# Patient Record
Sex: Male | Born: 1945 | Race: White | Hispanic: No | Marital: Married | Smoking: Smoker, current status unknown
Health system: Southern US, Community
[De-identification: ages and names within clinical notes are randomized; demographics above are authoritative.]

## PROBLEM LIST (undated history)

## (undated) DIAGNOSIS — I10 Essential (primary) hypertension: Secondary | ICD-10-CM

## (undated) DIAGNOSIS — E119 Type 2 diabetes mellitus without complications: Secondary | ICD-10-CM

## (undated) DIAGNOSIS — J449 Chronic obstructive pulmonary disease, unspecified: Secondary | ICD-10-CM

## (undated) DIAGNOSIS — I2510 Atherosclerotic heart disease of native coronary artery without angina pectoris: Secondary | ICD-10-CM

## (undated) HISTORY — PX: APPENDECTOMY: SHX54

## (undated) HISTORY — PX: CHOLECYSTECTOMY: SHX55

## (undated) HISTORY — PX: CORONARY ANGIOPLASTY WITH STENT PLACEMENT: SHX49

---

## 2019-04-06 DIAGNOSIS — I714 Abdominal aortic aneurysm, without rupture, unspecified: Secondary | ICD-10-CM

## 2019-04-06 DIAGNOSIS — R918 Other nonspecific abnormal finding of lung field: Secondary | ICD-10-CM

## 2019-04-06 HISTORY — DX: Abdominal aortic aneurysm, without rupture, unspecified: I71.40

## 2019-04-06 HISTORY — DX: Abdominal aortic aneurysm, without rupture: I71.4

## 2019-04-06 HISTORY — DX: Other nonspecific abnormal finding of lung field: R91.8

## 2019-04-10 ENCOUNTER — Inpatient Hospital Stay (HOSPITAL_COMMUNITY): Payer: Medicare Other

## 2019-04-10 ENCOUNTER — Encounter (HOSPITAL_COMMUNITY): Payer: Self-pay | Admitting: Internal Medicine

## 2019-04-10 ENCOUNTER — Other Ambulatory Visit: Payer: Self-pay

## 2019-04-10 ENCOUNTER — Inpatient Hospital Stay (HOSPITAL_COMMUNITY)
Admission: AD | Admit: 2019-04-10 | Discharge: 2019-04-15 | DRG: 871 | Disposition: A | Discharge status: Home Health | Payer: Medicare Other | Source: Other Acute Inpatient Hospital | Attending: Internal Medicine | Admitting: Internal Medicine

## 2019-04-10 DIAGNOSIS — Z886 Allergy status to analgesic agent status: Secondary | ICD-10-CM | POA: Diagnosis not present

## 2019-04-10 DIAGNOSIS — X58XXXA Exposure to other specified factors, initial encounter: Secondary | ICD-10-CM | POA: Diagnosis present

## 2019-04-10 DIAGNOSIS — S22089A Unspecified fracture of T11-T12 vertebra, initial encounter for closed fracture: Secondary | ICD-10-CM | POA: Diagnosis present

## 2019-04-10 DIAGNOSIS — J9811 Atelectasis: Secondary | ICD-10-CM | POA: Diagnosis present

## 2019-04-10 DIAGNOSIS — I2510 Atherosclerotic heart disease of native coronary artery without angina pectoris: Secondary | ICD-10-CM | POA: Diagnosis present

## 2019-04-10 DIAGNOSIS — Z9889 Other specified postprocedural states: Secondary | ICD-10-CM | POA: Diagnosis not present

## 2019-04-10 DIAGNOSIS — K219 Gastro-esophageal reflux disease without esophagitis: Secondary | ICD-10-CM | POA: Diagnosis present

## 2019-04-10 DIAGNOSIS — E43 Unspecified severe protein-calorie malnutrition: Secondary | ICD-10-CM | POA: Insufficient documentation

## 2019-04-10 DIAGNOSIS — R918 Other nonspecific abnormal finding of lung field: Secondary | ICD-10-CM | POA: Diagnosis present

## 2019-04-10 DIAGNOSIS — Z681 Body mass index (BMI) 19 or less, adult: Secondary | ICD-10-CM

## 2019-04-10 DIAGNOSIS — J189 Pneumonia, unspecified organism: Secondary | ICD-10-CM | POA: Diagnosis present

## 2019-04-10 DIAGNOSIS — I4891 Unspecified atrial fibrillation: Secondary | ICD-10-CM | POA: Diagnosis present

## 2019-04-10 DIAGNOSIS — Z9981 Dependence on supplemental oxygen: Secondary | ICD-10-CM | POA: Diagnosis not present

## 2019-04-10 DIAGNOSIS — J432 Centrilobular emphysema: Secondary | ICD-10-CM | POA: Diagnosis present

## 2019-04-10 DIAGNOSIS — J9621 Acute and chronic respiratory failure with hypoxia: Secondary | ICD-10-CM | POA: Diagnosis present

## 2019-04-10 DIAGNOSIS — J90 Pleural effusion, not elsewhere classified: Secondary | ICD-10-CM

## 2019-04-10 DIAGNOSIS — Z880 Allergy status to penicillin: Secondary | ICD-10-CM | POA: Diagnosis not present

## 2019-04-10 DIAGNOSIS — D649 Anemia, unspecified: Secondary | ICD-10-CM | POA: Diagnosis present

## 2019-04-10 DIAGNOSIS — F17210 Nicotine dependence, cigarettes, uncomplicated: Secondary | ICD-10-CM | POA: Diagnosis present

## 2019-04-10 DIAGNOSIS — I959 Hypotension, unspecified: Secondary | ICD-10-CM | POA: Diagnosis present

## 2019-04-10 DIAGNOSIS — Z955 Presence of coronary angioplasty implant and graft: Secondary | ICD-10-CM | POA: Diagnosis not present

## 2019-04-10 DIAGNOSIS — E119 Type 2 diabetes mellitus without complications: Secondary | ICD-10-CM | POA: Diagnosis present

## 2019-04-10 DIAGNOSIS — A419 Sepsis, unspecified organism: Principal | ICD-10-CM | POA: Diagnosis present

## 2019-04-10 DIAGNOSIS — R64 Cachexia: Secondary | ICD-10-CM | POA: Diagnosis present

## 2019-04-10 DIAGNOSIS — J449 Chronic obstructive pulmonary disease, unspecified: Secondary | ICD-10-CM | POA: Diagnosis not present

## 2019-04-10 DIAGNOSIS — R627 Adult failure to thrive: Secondary | ICD-10-CM | POA: Diagnosis present

## 2019-04-10 DIAGNOSIS — Z23 Encounter for immunization: Secondary | ICD-10-CM | POA: Diagnosis present

## 2019-04-10 DIAGNOSIS — I714 Abdominal aortic aneurysm, without rupture: Secondary | ICD-10-CM | POA: Diagnosis present

## 2019-04-10 HISTORY — DX: Chronic obstructive pulmonary disease, unspecified: J44.9

## 2019-04-10 HISTORY — DX: Type 2 diabetes mellitus without complications: E11.9

## 2019-04-10 HISTORY — DX: Essential (primary) hypertension: I10

## 2019-04-10 HISTORY — DX: Atherosclerotic heart disease of native coronary artery without angina pectoris: I25.10

## 2019-04-10 LAB — GLUCOSE, CAPILLARY
Glucose-Capillary: 85 mg/dL (ref 70–99)
Glucose-Capillary: 110 mg/dL — ABNORMAL HIGH (ref 70–99)
Glucose-Capillary: 119 mg/dL — ABNORMAL HIGH (ref 70–99)
Glucose-Capillary: 122 mg/dL — ABNORMAL HIGH (ref 70–99)

## 2019-04-10 LAB — TSH: TSH: 0.625 u[IU]/mL (ref 0.350–4.500)

## 2019-04-10 LAB — COMPREHENSIVE METABOLIC PANEL
ALT: 12 U/L (ref 0–44)
AST: 13 U/L — ABNORMAL LOW (ref 15–41)
Albumin: 2.3 g/dL — ABNORMAL LOW (ref 3.5–5.0)
Alkaline Phosphatase: 57 U/L (ref 38–126)
Anion gap: 10 (ref 5–15)
BUN: 19 mg/dL (ref 8–23)
CO2: 27 mmol/L (ref 22–32)
Calcium: 7.6 mg/dL — ABNORMAL LOW (ref 8.9–10.3)
Chloride: 98 mmol/L (ref 98–111)
Creatinine, Ser: 1.08 mg/dL (ref 0.61–1.24)
GFR calc Af Amer: >60 mL/min (ref >60)
GFR calc non Af Amer: >60 mL/min (ref >60)
Glucose, Bld: 89 mg/dL (ref 70–99)
Potassium: 3.8 mmol/L (ref 3.5–5.1)
Sodium: 135 mmol/L (ref 135–145)
Total Bilirubin: 0.5 mg/dL (ref 0.3–1.2)
Total Protein: 5.7 g/dL — ABNORMAL LOW (ref 6.5–8.1)

## 2019-04-10 LAB — CBC
HCT: 20.4 % — ABNORMAL LOW (ref 39.0–52.0)
HCT: 21.3 % — ABNORMAL LOW (ref 39.0–52.0)
Hemoglobin: 6.5 g/dL — CL (ref 13.0–17.0)
Hemoglobin: 6.6 g/dL — CL (ref 13.0–17.0)
MCH: 27.8 pg (ref 26.0–34.0)
MCH: 27 pg (ref 26.0–34.0)
MCHC: 31.9 g/dL (ref 30.0–36.0)
MCHC: 31 g/dL (ref 30.0–36.0)
MCV: 87.2 fL (ref 80.0–100.0)
MCV: 87.3 fL (ref 80.0–100.0)
Platelets: 383 10*3/uL (ref 150–400)
Platelets: 394 10*3/uL (ref 150–400)
RBC: 2.34 MIL/uL — ABNORMAL LOW (ref 4.22–5.81)
RBC: 2.44 MIL/uL — ABNORMAL LOW (ref 4.22–5.81)
RDW: 14.4 % (ref 11.5–15.5)
RDW: 14.4 % (ref 11.5–15.5)
WBC: 16.4 10*3/uL — ABNORMAL HIGH (ref 4.0–10.5)
WBC: 17.1 10*3/uL — ABNORMAL HIGH (ref 4.0–10.5)
nRBC: 0 % (ref 0.0–0.2)
nRBC: 0 % (ref 0.0–0.2)

## 2019-04-10 LAB — HEMOGLOBIN AND HEMATOCRIT, BLOOD
HCT: 26.1 % — ABNORMAL LOW (ref 39.0–52.0)
Hemoglobin: 8.3 g/dL — ABNORMAL LOW (ref 13.0–17.0)

## 2019-04-10 LAB — PROCALCITONIN: Procalcitonin: 0.91 ng/mL

## 2019-04-10 LAB — PREALBUMIN: Prealbumin: 8.9 mg/dL — ABNORMAL LOW (ref 18–38)

## 2019-04-10 LAB — ABO/RH: ABO/RH(D): A POS

## 2019-04-10 LAB — IRON AND TIBC
Iron: 8 ug/dL — ABNORMAL LOW (ref 45–182)
Saturation Ratios: 3 % — ABNORMAL LOW (ref 17.9–39.5)
TIBC: 243 ug/dL — ABNORMAL LOW (ref 250–450)
UIBC: 235 ug/dL

## 2019-04-10 LAB — VITAMIN B12: Vitamin B-12: 276 pg/mL (ref 180–914)

## 2019-04-10 LAB — LIPID PANEL
Cholesterol: 93 mg/dL (ref 0–200)
HDL: 27 mg/dL — ABNORMAL LOW (ref >40)
LDL Cholesterol: 52 mg/dL (ref 0–99)
Total CHOL/HDL Ratio: 3.4 RATIO
Triglycerides: 69 mg/dL (ref <150)
VLDL: 14 mg/dL (ref 0–40)

## 2019-04-10 LAB — VITAMIN D 25 HYDROXY (VIT D DEFICIENCY, FRACTURES): Vit D, 25-Hydroxy: 6.87 ng/mL — ABNORMAL LOW (ref 30–100)

## 2019-04-10 LAB — PREPARE RBC (CROSSMATCH)

## 2019-04-10 LAB — MRSA PCR SCREENING: MRSA by PCR: NEGATIVE

## 2019-04-10 LAB — FERRITIN: Ferritin: 73 ng/mL (ref 24–336)

## 2019-04-10 MED ORDER — SODIUM CHLORIDE 0.9% IV SOLUTION: Freq: Once | INTRAVENOUS | Status: DC

## 2019-04-10 MED ORDER — IOHEXOL 300 MG/ML  SOLN
75.0000 mL | Freq: Once | INTRAMUSCULAR | Status: AC | PRN
  Administered 2019-04-10: 13:00:00 75 mL via INTRAVENOUS

## 2019-04-10 MED ORDER — MOMETASONE FURO-FORMOTEROL FUM 200-5 MCG/ACT IN AERO
2.0000 | INHALATION_SPRAY | Freq: Two times a day (BID) | RESPIRATORY_TRACT | Status: DC
  Administered 2019-04-10 – 2019-04-15 (×10): 2 via RESPIRATORY_TRACT
  Filled 2019-04-10: qty 8.8

## 2019-04-10 MED ORDER — ENOXAPARIN SODIUM 40 MG/0.4ML ~~LOC~~ SOLN
40.0000 mg | SUBCUTANEOUS | Status: DC
  Administered 2019-04-10 – 2019-04-14 (×5): 40 mg via SUBCUTANEOUS
  Filled 2019-04-10 (×6): qty 0.4

## 2019-04-10 MED ORDER — ONDANSETRON HCL 4 MG/2ML IJ SOLN: 4.0000 mg | Freq: Four times a day (QID) | INTRAMUSCULAR | Status: DC | PRN

## 2019-04-10 MED ORDER — INFLUENZA VAC A&B SA ADJ QUAD 0.5 ML IM PRSY
0.5000 mL | PREFILLED_SYRINGE | INTRAMUSCULAR | Status: AC
  Administered 2019-04-12: 0.5 mL via INTRAMUSCULAR
  Filled 2019-04-10: qty 0.5

## 2019-04-10 MED ORDER — PNEUMOCOCCAL VAC POLYVALENT 25 MCG/0.5ML IJ INJ
0.5000 mL | INJECTION | INTRAMUSCULAR | Status: AC
  Administered 2019-04-12: 11:00:00 0.5 mL via INTRAMUSCULAR
  Filled 2019-04-10: qty 0.5

## 2019-04-10 MED ORDER — ADULT MULTIVITAMIN W/MINERALS CH
1.0000 | ORAL_TABLET | Freq: Every day | ORAL | Status: DC
  Administered 2019-04-11 – 2019-04-15 (×5): 1 via ORAL
  Filled 2019-04-10 (×5): qty 1

## 2019-04-10 MED ORDER — SODIUM CHLORIDE 0.9 % IV SOLN
500.0000 mg | INTRAVENOUS | Status: DC
  Administered 2019-04-10 – 2019-04-12 (×3): 500 mg via INTRAVENOUS
  Filled 2019-04-10 (×3): qty 500

## 2019-04-10 MED ORDER — VITAMIN B-12 1000 MCG PO TABS
1000.0000 ug | ORAL_TABLET | Freq: Every day | ORAL | Status: DC
  Administered 2019-04-10 – 2019-04-15 (×6): 1000 ug via ORAL
  Filled 2019-04-10 (×6): qty 1

## 2019-04-10 MED ORDER — CHLORHEXIDINE GLUCONATE CLOTH 2 % EX PADS
6.0000 | MEDICATED_PAD | Freq: Every day | CUTANEOUS | Status: DC
  Administered 2019-04-11 – 2019-04-13 (×3): 6 via TOPICAL

## 2019-04-10 MED ORDER — INSULIN ASPART 100 UNIT/ML ~~LOC~~ SOLN
0.0000 [IU] | Freq: Three times a day (TID) | SUBCUTANEOUS | Status: DC
  Administered 2019-04-11: 17:00:00 3 [IU] via SUBCUTANEOUS
  Administered 2019-04-11: 2 [IU] via SUBCUTANEOUS
  Administered 2019-04-13 – 2019-04-14 (×2): 1 [IU] via SUBCUTANEOUS
  Administered 2019-04-14: 2 [IU] via SUBCUTANEOUS

## 2019-04-10 MED ORDER — SODIUM CHLORIDE 0.9% FLUSH
3.0000 mL | Freq: Two times a day (BID) | INTRAVENOUS | Status: DC
  Administered 2019-04-10 – 2019-04-15 (×8): 3 mL via INTRAVENOUS

## 2019-04-10 MED ORDER — ASPIRIN EC 81 MG PO TBEC
81.0000 mg | DELAYED_RELEASE_TABLET | Freq: Every day | ORAL | Status: DC
  Administered 2019-04-10 – 2019-04-13 (×4): 81 mg via ORAL
  Filled 2019-04-10 (×4): qty 1

## 2019-04-10 MED ORDER — SODIUM CHLORIDE 0.9% FLUSH: 10.0000 mL | INTRAVENOUS | Status: DC | PRN

## 2019-04-10 MED ORDER — ALBUTEROL SULFATE (2.5 MG/3ML) 0.083% IN NEBU: 3.0000 mL | INHALATION_SOLUTION | Freq: Four times a day (QID) | RESPIRATORY_TRACT | Status: DC | PRN

## 2019-04-10 MED ORDER — INSULIN ASPART 100 UNIT/ML ~~LOC~~ SOLN: 0.0000 [IU] | Freq: Every day | SUBCUTANEOUS | Status: DC

## 2019-04-10 MED ORDER — LISINOPRIL 5 MG PO TABS
5.0000 mg | ORAL_TABLET | Freq: Every day | ORAL | Status: DC
  Administered 2019-04-10 – 2019-04-15 (×5): 5 mg via ORAL
  Filled 2019-04-10: qty 1
  Filled 2019-04-10: qty 2
  Filled 2019-04-10: qty 1
  Filled 2019-04-10: qty 2
  Filled 2019-04-10: qty 1

## 2019-04-10 MED ORDER — PANTOPRAZOLE SODIUM 40 MG PO TBEC
40.0000 mg | DELAYED_RELEASE_TABLET | Freq: Every day | ORAL | Status: DC
  Administered 2019-04-10 – 2019-04-15 (×6): 40 mg via ORAL
  Filled 2019-04-10 (×6): qty 1

## 2019-04-10 MED ORDER — ENSURE ENLIVE PO LIQD
237.0000 mL | Freq: Three times a day (TID) | ORAL | Status: DC
  Administered 2019-04-10 – 2019-04-14 (×8): 237 mL via ORAL

## 2019-04-10 MED ORDER — CARVEDILOL 6.25 MG PO TABS
6.2500 mg | ORAL_TABLET | Freq: Two times a day (BID) | ORAL | Status: DC
  Administered 2019-04-10 – 2019-04-15 (×10): 6.25 mg via ORAL
  Filled 2019-04-10 (×10): qty 1

## 2019-04-10 MED ORDER — FUROSEMIDE 40 MG PO TABS
40.0000 mg | ORAL_TABLET | Freq: Two times a day (BID) | ORAL | Status: DC
  Administered 2019-04-10 – 2019-04-11 (×3): 40 mg via ORAL
  Filled 2019-04-10 (×3): qty 1

## 2019-04-10 MED ORDER — MIRTAZAPINE 15 MG PO TABS
30.0000 mg | ORAL_TABLET | Freq: Every day | ORAL | Status: DC
  Administered 2019-04-10 – 2019-04-14 (×5): 30 mg via ORAL
  Filled 2019-04-10 (×5): qty 2

## 2019-04-10 MED ORDER — SODIUM CHLORIDE 0.9% FLUSH
10.0000 mL | Freq: Two times a day (BID) | INTRAVENOUS | Status: DC
  Administered 2019-04-10 – 2019-04-12 (×5): 10 mL
  Administered 2019-04-12: 22:00:00 15 mL
  Administered 2019-04-14 – 2019-04-15 (×2): 10 mL

## 2019-04-10 MED ORDER — SODIUM CHLORIDE (PF) 0.9 % IJ SOLN
INTRAMUSCULAR | Status: AC
  Filled 2019-04-10: qty 50

## 2019-04-10 MED ORDER — FERROUS SULFATE 325 (65 FE) MG PO TABS
325.0000 mg | ORAL_TABLET | Freq: Two times a day (BID) | ORAL | Status: DC
  Administered 2019-04-10 – 2019-04-15 (×11): 325 mg via ORAL
  Filled 2019-04-10 (×11): qty 1

## 2019-04-10 MED ORDER — VITAMIN B-12 1000 MCG SL SUBL: 1.0000 | SUBLINGUAL_TABLET | Freq: Every day | SUBLINGUAL | Status: DC

## 2019-04-10 MED ORDER — SODIUM CHLORIDE 0.9 % IV SOLN
1.0000 g | INTRAVENOUS | Status: DC
  Administered 2019-04-10 – 2019-04-15 (×6): 1 g via INTRAVENOUS
  Filled 2019-04-10 (×6): qty 1

## 2019-04-10 MED ORDER — TICAGRELOR 90 MG PO TABS
90.0000 mg | ORAL_TABLET | Freq: Two times a day (BID) | ORAL | Status: DC
  Administered 2019-04-10 – 2019-04-13 (×7): 90 mg via ORAL
  Filled 2019-04-10 (×8): qty 1

## 2019-04-10 MED ORDER — ACETAMINOPHEN 325 MG PO TABS: 650.0000 mg | ORAL_TABLET | Freq: Four times a day (QID) | ORAL | Status: DC | PRN

## 2019-04-10 MED ORDER — ORAL CARE MOUTH RINSE
15.0000 mL | Freq: Two times a day (BID) | OROMUCOSAL | Status: DC
  Administered 2019-04-10 – 2019-04-14 (×9): 15 mL via OROMUCOSAL

## 2019-04-10 MED ORDER — SODIUM CHLORIDE 0.9 % IV BOLUS
250.0000 mL | Freq: Once | INTRAVENOUS | Status: AC
  Administered 2019-04-10: 16:00:00 250 mL via INTRAVENOUS

## 2019-04-10 MED ORDER — SODIUM CHLORIDE 0.9% FLUSH: 3.0000 mL | INTRAVENOUS | Status: DC | PRN

## 2019-04-10 MED ORDER — ACETAMINOPHEN 650 MG RE SUPP: 650.0000 mg | Freq: Four times a day (QID) | RECTAL | Status: DC | PRN

## 2019-04-10 MED ORDER — SODIUM CHLORIDE 0.9 % IV SOLN
250.0000 mL | INTRAVENOUS | Status: DC | PRN
  Administered 2019-04-10: 07:00:00 250 mL via INTRAVENOUS

## 2019-04-10 NOTE — Progress Notes (Addendum)
CRITICAL VALUE ALERT  Critical Value: hemoglobin 6.5  Date & Time Notied:  04/10/2019 0836  Provider Notified: Dr Marthenia Rolling  Orders Received/Actions taken: Dr Marthenia Rolling ordered a repeat lab draw

## 2019-04-10 NOTE — Progress Notes (Signed)
Calld for 6cm RUL mass consult. Send outside CD rom to radioogy dept but they called back to [facility] the ICU secertary and reported that has no image on it. Pulmnary will order a CT chest with contrast here. Pleae call back for consult based on findings in the CT here if pulmonary is required     SIGNATURE    Dr. Brand Males, M.D., F.C.C.P,  Pulmonary and Critical Care Medicine Staff Physician, [place] Director - Interstitial Lung Disease  Program  Pulmonary [place] [facility] [place], [place], [address]  Pager: [phone], If no answer or between  15:00h - 7:00h: call [phone] Telephone: [phone]  11:40 AM 04/10/2019     Allergies  Allergen Reactions  . Aspirin Other (See Comments)    Questionable.. unk.. on paperwork  . Penicillins Other (See Comments)    Unk.. on paperwork    Estimated Creatinine Clearance: 42 mL/min (by C-G formula based on SCr of 1.08 mg/dL). Recent Labs  Lab 04/10/19 0630  CREATININE 1.08

## 2019-04-10 NOTE — Progress Notes (Signed)
CRITICAL VALUE ALERT  Critical Value:  Hemoglobin 6.6  Date & Time Notied: 1884 04/10/2019  Provider Notified: Dr. Marthenia Rolling  Orders Received/Actions taken: Awaiting

## 2019-04-10 NOTE — H&P (Addendum)
TRH H&P    Patient Demographics:    Eduardo Osborne, is a 73 y.o. male  MRN: [id number]  DOB - 04/07/1946  Admit Date - 04/10/2019  Referring MD/NP/PA:   Nino Glowatalin Osborne  Outpatient Primary MD for the patient is No primary care provider on file.  [facility]  Chief complaint-  Lung mass   HPI:    Eduardo Osborne  is a 73 y.o. male,  w CAD , Afib, Dm2, Anemia,  Copd on home o2, w history of 8mm lung mass 07/22/2018  apparently presented on 04/06/2019 to [facility] for evaluation of hypoxia.  Pox 74%, Wbc 21.8, Hgb 7.8,  D dimer 2952 CTA chest-> 6.9cm lung mass in the right upper lung.   Pt was admitted for acute respiratory failure and placed on Bipap, tx with vanco, rocephin, and zithromax.    [facility] evaluation of right upper lung mass    Review of systems:    In addition to the HPI above,  No Fever-chills, No Headache, No changes with Vision or hearing, No problems swallowing food or Liquids, No Chest pain, + cough, + sob (chronic) No Abdominal pain, No Nausea or Vomiting, bowel movements are regular, No Blood in stool or Urine, No dysuria, No new skin rashes or bruises, No new joints pains-aches,  No new weakness, tingling, numbness in any extremity, No recent weight gain or loss, No polyuria, polydypsia or polyphagia, No significant Mental Stressors.  All other systems reviewed and are negative.    Past History of the following :    Past Medical History:  Diagnosis Date  . CAD (coronary artery disease)   . COPD (chronic obstructive pulmonary disease) (HCC)    on home o2  . Diabetes (HCC)   . Hypertension   . Lung mass 04/06/2019      Past Surgical History:  Procedure Laterality Date  . APPENDECTOMY    . CHOLECYSTECTOMY    . CORONARY ANGIOPLASTY WITH STENT PLACEMENT        Social History:      Social  History   Tobacco Use  . Smoking status: Smoker, Current Status Unknown    Types: Cigarettes  . Smokeless tobacco: Never Used  Substance Use Topics  . Alcohol use: Not Currently       Family History :     Family History  Problem Relation Age of Onset  . Heart attack Father        Home Medications:   Prior to Admission medications   Not on File     Allergies:     Allergies  Allergen Reactions  . Aspirin Other (See Comments)    Questionable.. unk.. on paperwork  . Penicillins Other (See Comments)    Unk.. on paperwork     Physical Exam:   Vitals  Blood pressure (!) 146/65, pulse 89, temperature 98.1 F (36.7 C), temperature source Oral, resp. rate (!) 26, height 5\' 11"  (1.803 m), weight 50.3 kg, SpO2 99 %.  1.  General: axoxo3  2. Psychiatric: euthymic  3. Neurologic: cn2-12 intact, reflexes 2+ symmetric, diffuse with no clonus, motor 5/5 in all 4 exst  4. HEENMT:  Anicteric, pupils 1.60mm symmetric, direct, consensual, near intact Neck: no jvd  5. Respiratory : Prolonged exp phase, slight exp wheezing,   6. Cardiovascular : rrr s1, s2,   7. Gastrointestinal:  ABd: soft, nt, nd, +bs  8. Skin:  Ext: no c/c/e,  seb keratosis on the right temple area  9.Musculoskeletal:  Good ROM    Data Review:    CBC No results for input(s): WBC, HGB, HCT, PLT, MCV, MCH, MCHC, RDW, LYMPHSABS, MONOABS, EOSABS, BASOSABS, BANDABS in the last 168 hours.  Invalid input(s): NEUTRABS, BANDSABD ------------------------------------------------------------------------------------------------------------------  Results for orders placed or performed during the hospital encounter of 04/10/19 (from the past 48 hour(s))  MRSA PCR Screening     Status: None   Collection Time: 04/10/19  4:36 AM   Specimen: Nasal Mucosa; Nasopharyngeal  Result Value Ref Range   MRSA by PCR NEGATIVE NEGATIVE    Comment:        The GeneXpert MRSA Assay (FDA approved for NASAL  specimens only), is one component of a comprehensive MRSA colonization surveillance program. It is not intended to diagnose MRSA infection nor to guide or monitor treatment for MRSA infections. Performed at [facility], [address] [place], [place] [address]     Chemistries  No results for input(s): NA, K, CL, CO2, GLUCOSE, BUN, CREATININE, CALCIUM, MG, AST, ALT, ALKPHOS, BILITOT in the last 168 hours.  Invalid input(s): GFRCGP ------------------------------------------------------------------------------------------------------------------  ------------------------------------------------------------------------------------------------------------------ GFR: CrCl cannot be calculated (No successful lab value found.). Liver Function Tests: No results for input(s): AST, ALT, ALKPHOS, BILITOT, PROT, ALBUMIN in the last 168 hours. No results for input(s): LIPASE, AMYLASE in the last 168 hours. No results for input(s): AMMONIA in the last 168 hours. Coagulation Profile: No results for input(s): INR, PROTIME in the last 168 hours. Cardiac Enzymes: No results for input(s): CKTOTAL, CKMB, CKMBINDEX, TROPONINI in the last 168 hours. BNP (last 3 results) No results for input(s): PROBNP in the last 8760 hours. HbA1C: No results for input(s): HGBA1C in the last 72 hours. CBG: No results for input(s): GLUCAP in the last 168 hours. Lipid Profile: No results for input(s): CHOL, HDL, LDLCALC, TRIG, CHOLHDL, LDLDIRECT in the last 72 hours. Thyroid Function Tests: No results for input(s): TSH, T4TOTAL, FREET4, T3FREE, THYROIDAB in the last 72 hours. Anemia Panel: No results for input(s): VITAMINB12, FOLATE, FERRITIN, TIBC, IRON, RETICCTPCT in the last 72 hours.  --------------------------------------------------------------------------------------------------------------- Urine analysis: No results found for: COLORURINE, APPEARANCEUR, LABSPEC, PHURINE, GLUCOSEU,  HGBUR, BILIRUBINUR, KETONESUR, PROTEINUR, UROBILINOGEN, NITRITE, LEUKOCYTESUR    Imaging Results:    No results found.     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  Code Status:  FULL CODE per patient,   Admission status:  observationt: Based on patients clinical presentation and evaluation of above clinical data, I have made determination that patient meets observation criteria at this time.  Time spent in minutes : 70    Jani Gravel M.D on 04/10/2019 at 6:08 AM

## 2019-04-10 NOTE — Progress Notes (Signed)
-  Patient seen. -Patient was transferred to this hospital from no [facility], and admitted earlier today. -Reason for transfer is documented as 6.9 cm right upper lobe lung mass. -Pulmonary team has been consulted.  Also discussed personally with the pulmonary team. -Patient was on antibiotics for possible pneumonia prior to transfer to this hospital.  Leukocytosis of 25,000 was documented.  WBC is down to 16,500.  Also is negative for MRSA.  We will continue Rocephin and azithromycin.  We will continue to monitor CBC.  No constitutional symptoms noted except for likely weight loss. -Hemoglobin was 6.5 g/dL with normal MCV (87.2).   -We will repeat CBC.  Will check B12 and folate level.  Check stool for fecal occult blood. -Social history: Patient is a widower.  Patient has 1 child that may not be local.  Patient lives by himself.  Not sure if patient copes well at home.  Patient has 60 pack years.    -Further management depend on hospital course.

## 2019-04-10 NOTE — Progress Notes (Signed)
Initial Nutrition Assessment  DOCUMENTATION CODES:   Underweight, Severe malnutrition in context of chronic illness  INTERVENTION:   - Ensure Enlive po TID, each supplement provides 350 kcal and 20 grams of protein  - Liberalize diet to Regular  - MVI with minerals daily  - Encourage adequate PO intake  NUTRITION DIAGNOSIS:   Severe Malnutrition related to chronic illness (COPD) as evidenced by severe fat depletion, severe muscle depletion.  GOAL:   Patient will meet greater than or equal to 90% of their needs  MONITOR:   PO intake, Supplement acceptance, Labs, Weight trends  REASON FOR ASSESSMENT:   Other (low BMI)    ASSESSMENT:   73 year old male who presented on 11/05. Pt had presented to OSH on 11/01 with hypoxia. PMH of CAD, atrial fibrillation, T2DM, anemia, COPD, history of lung mass.   Spoke with pt at bedside. Pt reports that the sausage he received on his breakfast tray was too chewy to eat. Pt states that he was abel to eat some of the other foods on his meal tray. No meal completion recorded in chart.  Pt reports that he typically has a very good appetite "when I can get food." Pt states that he typically eats 2-3 meals daily at home. Pt states he does not drink oral nutrition supplements because he cannot afford them. Pt amenable to RD ordering Ensure Enlive during admission.  Pt endorses weight loss but is unable to provide his UBW. No weight history available in chart.  Pt denies any N/V or difficulties chewing or swallowing.  Pt requesting RD order his lunch meal: cheeseburger, fries, cake, and coffee. Per discussion with attending MD, okay to liberalize diet to Regular.  Medications reviewed and include: ferrous sulfate, Lasix, SSI, Remeron, Protonix, vitamin B-12, IV abx  Labs reviewed: hemoglobin 6.5  NUTRITION - FOCUSED PHYSICAL EXAM:    Most Recent Value  Orbital Region  Severe depletion  Upper Arm Region  Severe depletion  Thoracic and  Lumbar Region  Severe depletion  Buccal Region  Severe depletion  Temple Region  Severe depletion  Clavicle Bone Region  Severe depletion  Clavicle and Acromion Bone Region  Severe depletion  Scapular Bone Region  Severe depletion  Dorsal Hand  Severe depletion  Patellar Region  Severe depletion  Anterior Thigh Region  Severe depletion  Posterior Calf Region  Severe depletion  Edema (RD Assessment)  None  Hair  Reviewed  Eyes  Reviewed  Mouth  Reviewed  Skin  Reviewed  Nails  Reviewed       Diet Order:   Diet Order            Diet regular Room service appropriate? Yes; Fluid consistency: Thin  Diet effective now              EDUCATION NEEDS:   Education needs have been addressed  Skin:  Skin Assessment: Reviewed RN Assessment  Last BM:  04/06/19  Height:   Ht Readings from Last 1 Encounters:  04/10/19 5\' 11"  (1.803 m)    Weight:   Wt Readings from Last 1 Encounters:  04/10/19 50.3 kg    Ideal Body Weight:  78.2 kg  BMI:  Body mass index is 15.47 kg/m.  Estimated Nutritional Needs:   Kcal:  1500-1700  Protein:  70-80 grams  Fluid:  >/= 1.5 L    Gaynell Face, MS, RD, LDN Inpatient Clinical Dietitian Pager: [phone] Weekend/After Hours: [phone]

## 2019-04-11 ENCOUNTER — Inpatient Hospital Stay (HOSPITAL_COMMUNITY): Payer: Medicare Other

## 2019-04-11 DIAGNOSIS — J90 Pleural effusion, not elsewhere classified: Secondary | ICD-10-CM

## 2019-04-11 DIAGNOSIS — E43 Unspecified severe protein-calorie malnutrition: Secondary | ICD-10-CM | POA: Insufficient documentation

## 2019-04-11 DIAGNOSIS — Z9889 Other specified postprocedural states: Secondary | ICD-10-CM

## 2019-04-11 DIAGNOSIS — R918 Other nonspecific abnormal finding of lung field: Secondary | ICD-10-CM

## 2019-04-11 LAB — GLUCOSE, CAPILLARY
Glucose-Capillary: 107 mg/dL — ABNORMAL HIGH (ref 70–99)
Glucose-Capillary: 182 mg/dL — ABNORMAL HIGH (ref 70–99)
Glucose-Capillary: 232 mg/dL — ABNORMAL HIGH (ref 70–99)
Glucose-Capillary: 139 mg/dL — ABNORMAL HIGH (ref 70–99)

## 2019-04-11 LAB — BASIC METABOLIC PANEL
Anion gap: 9 (ref 5–15)
BUN: 29 mg/dL — ABNORMAL HIGH (ref 8–23)
CO2: 30 mmol/L (ref 22–32)
Calcium: 7.7 mg/dL — ABNORMAL LOW (ref 8.9–10.3)
Chloride: 102 mmol/L (ref 98–111)
Creatinine, Ser: 1.37 mg/dL — ABNORMAL HIGH (ref 0.61–1.24)
GFR calc Af Amer: 58 mL/min — ABNORMAL LOW (ref >60)
GFR calc non Af Amer: 50 mL/min — ABNORMAL LOW (ref >60)
Glucose, Bld: 104 mg/dL — ABNORMAL HIGH (ref 70–99)
Potassium: 3.5 mmol/L (ref 3.5–5.1)
Sodium: 141 mmol/L (ref 135–145)

## 2019-04-11 LAB — TYPE AND SCREEN
ABO/RH(D): A POS
Antibody Screen: NEGATIVE
Unit division: 0

## 2019-04-11 LAB — COMPREHENSIVE METABOLIC PANEL
ALT: 13 U/L (ref 0–44)
AST: 11 U/L — ABNORMAL LOW (ref 15–41)
Albumin: 2.6 g/dL — ABNORMAL LOW (ref 3.5–5.0)
Alkaline Phosphatase: 66 U/L (ref 38–126)
Anion gap: 12 (ref 5–15)
BUN: 24 mg/dL — ABNORMAL HIGH (ref 8–23)
CO2: 30 mmol/L (ref 22–32)
Calcium: 8.2 mg/dL — ABNORMAL LOW (ref 8.9–10.3)
Chloride: 96 mmol/L — ABNORMAL LOW (ref 98–111)
Creatinine, Ser: 1.22 mg/dL (ref 0.61–1.24)
GFR calc Af Amer: >60 mL/min (ref >60)
GFR calc non Af Amer: 58 mL/min — ABNORMAL LOW (ref >60)
Glucose, Bld: 133 mg/dL — ABNORMAL HIGH (ref 70–99)
Potassium: 2.8 mmol/L — ABNORMAL LOW (ref 3.5–5.1)
Sodium: 138 mmol/L (ref 135–145)
Total Bilirubin: 1.3 mg/dL — ABNORMAL HIGH (ref 0.3–1.2)
Total Protein: 6.5 g/dL (ref 6.5–8.1)

## 2019-04-11 LAB — FOLATE RBC
Folate, Hemolysate: 259 ng/mL
Folate, RBC: 1328 ng/mL (ref >498)
Hematocrit: 19.5 % — ABNORMAL LOW (ref 37.5–51.0)

## 2019-04-11 LAB — CBC WITH DIFFERENTIAL/PLATELET
Abs Immature Granulocytes: 0.29 10*3/uL — ABNORMAL HIGH (ref 0.00–0.07)
Basophils Absolute: 0.1 10*3/uL (ref 0.0–0.1)
Basophils Relative: 0 %
Eosinophils Absolute: 2.7 10*3/uL — ABNORMAL HIGH (ref 0.0–0.5)
Eosinophils Relative: 10 %
HCT: 28.5 % — ABNORMAL LOW (ref 39.0–52.0)
Hemoglobin: 8.9 g/dL — ABNORMAL LOW (ref 13.0–17.0)
Immature Granulocytes: 1 %
Lymphocytes Relative: 5 %
Lymphs Abs: 1.4 10*3/uL (ref 0.7–4.0)
MCH: 27.1 pg (ref 26.0–34.0)
MCHC: 31.2 g/dL (ref 30.0–36.0)
MCV: 86.9 fL (ref 80.0–100.0)
Monocytes Absolute: 1 10*3/uL (ref 0.1–1.0)
Monocytes Relative: 4 %
Neutro Abs: 22.5 10*3/uL — ABNORMAL HIGH (ref 1.7–7.7)
Neutrophils Relative %: 80 %
Platelets: 492 10*3/uL — ABNORMAL HIGH (ref 150–400)
RBC: 3.28 MIL/uL — ABNORMAL LOW (ref 4.22–5.81)
RDW: 14.4 % (ref 11.5–15.5)
WBC: 27.9 10*3/uL — ABNORMAL HIGH (ref 4.0–10.5)
nRBC: 0 % (ref 0.0–0.2)

## 2019-04-11 LAB — CHOLESTEROL, TOTAL: Cholesterol: 100 mg/dL (ref 0–200)

## 2019-04-11 LAB — PROTEIN, PLEURAL OR PERITONEAL FLUID: Total protein, fluid: <3 g/dL

## 2019-04-11 LAB — BODY FLUID CELL COUNT WITH DIFFERENTIAL
Lymphs, Fluid: 6 %
Monocyte-Macrophage-Serous Fluid: 14 % — ABNORMAL LOW (ref 50–90)
Neutrophil Count, Fluid: 80 % — ABNORMAL HIGH (ref 0–25)
Total Nucleated Cell Count, Fluid: 1067 cu mm — ABNORMAL HIGH (ref 0–1000)

## 2019-04-11 LAB — LACTATE DEHYDROGENASE, PLEURAL OR PERITONEAL FLUID: LD, Fluid: 107 U/L — ABNORMAL HIGH (ref 3–23)

## 2019-04-11 LAB — BPAM RBC
Blood Product Expiration Date: 202012052359
ISSUE DATE / TIME: 202011051529
Unit Type and Rh: 6200

## 2019-04-11 LAB — PROTEIN, TOTAL: Total Protein: 6 g/dL — ABNORMAL LOW (ref 6.5–8.1)

## 2019-04-11 LAB — PATHOLOGIST SMEAR REVIEW

## 2019-04-11 LAB — LACTATE DEHYDROGENASE: LDH: 109 U/L (ref 98–192)

## 2019-04-11 LAB — MAGNESIUM: Magnesium: 1.8 mg/dL (ref 1.7–2.4)

## 2019-04-11 MED ORDER — SODIUM CHLORIDE 0.9 % IV BOLUS
500.0000 mL | Freq: Once | INTRAVENOUS | Status: AC
  Administered 2019-04-11: 18:00:00 500 mL via INTRAVENOUS

## 2019-04-11 MED ORDER — SODIUM CHLORIDE 0.9 % IV BOLUS
500.0000 mL | Freq: Once | INTRAVENOUS | Status: AC
  Administered 2019-04-11: 14:00:00 500 mL via INTRAVENOUS

## 2019-04-11 MED ORDER — POTASSIUM CHLORIDE CRYS ER 20 MEQ PO TBCR
40.0000 meq | EXTENDED_RELEASE_TABLET | ORAL | Status: AC
  Administered 2019-04-11 (×2): 40 meq via ORAL
  Filled 2019-04-11 (×2): qty 2

## 2019-04-11 MED ORDER — MAGNESIUM SULFATE 2 GM/50ML IV SOLN
2.0000 g | Freq: Once | INTRAVENOUS | Status: AC
  Administered 2019-04-11: 11:00:00 2 g via INTRAVENOUS
  Filled 2019-04-11: qty 50

## 2019-04-11 NOTE — Progress Notes (Signed)
PROGRESS NOTE    Eduardo Osborne  GGY:[id number] DOB: [birth year] DOA: 04/10/2019 PCP: Garvin Fila, MD  Outpatient Specialists:   Brief Narrative:  Patient is a 73 year old Caucasian male, cachectic, disheveled, possibly failing to thrive, with past medical history significant for coronary artery disease, atrial fibrillation, diabetes mellitus type 2, and anemia, COPD on home oxygen, 60 pack years and history of 8 mm of lung mass also 07/22/2018.  Patient was admitted to [facility] with greater than 6.9 cm right upper lobe lung mass, possible pneumonia/sepsis.  Patient was transferred to [facility] health system for further management of lung mass.  Pulmonary team has been consulted to assist with patient's management.  Significant leukocytosis is noted.  CT chest with contrast done in this hospital on 04/10/2019 revealed indeterminate 2.9 x 6.3 cm anterior medial right upper lobe opacity with mildly enlarged right mediastinal lymph node, said to be suspicious for malignancy but infection could not be ruled out.  1.1 cm right middle lobe groundglass nodule and 1.1 cm right lower lobe irregular opacity was also recommended.  Moderate left-sided effusion was reported with moderate left lower lobe atelectasis.  Findings suggestive of coronary artery disease and aortic atherosclerosis were noted.  Pulmonary team is assisting with patient's care.  Thoracentesis is planned today and pleural fluid will be sent for analysis.  Patient has had intermittent episodes of hypotension that have responded to volume resuscitation.  Further management will depend on hospital course.   Assessment & Plan:   Principal Problem:   Lung mass Active Problems:   COPD (chronic obstructive pulmonary disease) (HCC)   Protein-calorie malnutrition, severe  Right upper lobe lung mass:  -Repeat CT chest is as documented above.   -Pulmonary input is highly appreciated.   -For thoracentesis today (patient  has moderate left pleural effusion), and spinal fluid analysis.  -Patient has more than 60 pack years. -Patient has been pancultured as well.   -Patient is currently on IV antibiotics.   -Further management will depend on hospital course.    Possible pneumonia/SIRS/sepsis: -Follow cultures.   -Patient is currently on IV ceftriaxone and azithromycin.   -Procalcitonin checked earlier was 0.19.   -Intermittent episodes of hypotension has been documented.   -Further management will depend on hospital course.   -Repeat procalcitonin in the morning.      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  Compression fracture  T12, new mild naterior compression fracture T11 Check vitamin D 25-oh -TSH is 0.625.,  CAD s/p DES Cont Aspirin. Cont Brillinta. Cont Carvedilol (If blood pressure allows). Cont  Lisinopril (if blood pressure allows).  Gerd Cont PPI  Anemia: -Iron level is 8 -Ferritin is 73 -TIBC is 243 -MCV is normal. -Cannot rule out anemia of chronic inflammation versus anemia secondary to combined iron deficiency and chronic inflammation. -RBC folate level is 1328 -Vitamin B12 is 276. -Fecal occult blood is pending. -Patient has been transfused with 1 unit of packed red blood cells. -Hemoglobin today is 8.9 g/dL (hemoglobin was 6.6 g/dL on 03/50/0938)  Severe malnutrition/weight loss/failure to thrive: Prealbumin is 8.9. Albumin is 2.6. Dietary consult. Patient has good appetite. BMI is 15.47 kg/m.  DVT prophylaxis: Subcutaneous Lovenox Code Status: Full code Family Communication:  Disposition Plan: This will depend on hospital course   Consultants:   Pulmonary/critical care team  Procedures:   Thoracentesis is planned today  Antimicrobials:   IV ceftriaxone  IV azithromycin   Subjective: Patient is  a poor historian Denies fever or chills. No chest pain or shortness of breath No significant cough or  phlegm production.  Objective: Vitals:   04/11/19 0603 04/11/19 0751 04/11/19 0800 04/11/19 0900  BP:   (!) 130/58 127/60  Pulse:   76 87  Resp:   (!) 21 (!) 21  Temp:  98 F (36.7 C)    TempSrc:  Oral    SpO2: 90%  98% 97%  Weight:      Height:        Intake/Output Summary (Last 24 hours) at 04/11/2019 1028 Last data filed at 04/11/2019 0439 Gross per 24 hour  Intake 1182.29 ml  Output 2330 ml  Net -1147.71 ml   Filed Weights   04/10/19 0536 04/11/19 0500  Weight: 50.3 kg 50.3 kg    Examination:  General exam: Appears cachectic and disheveled. Respiratory system: Decreased air entry. Cardiovascular system: S1 & S2 heard Gastrointestinal system: Abdomen is nondistended, soft and nontender. No organomegaly or masses felt. Normal bowel sounds heard. Central nervous system: Alert and oriented.  Patient moves all extremities.   Extremities: No leg edema.  Data Reviewed: I have personally reviewed following labs and imaging studies  CBC: Recent Labs  Lab 04/10/19 0630 04/10/19 1015 04/10/19 2030 04/11/19 0500  WBC 16.4* 17.1*  --  27.9*  NEUTROABS  --   --   --  22.5*  HGB 6.5* 6.6* 8.3* 8.9*  HCT 20.4* 21.3* 26.1* 28.5*  MCV 87.2 87.3  --  86.9  PLT 383 394  --  492*   Basic Metabolic Panel: Recent Labs  Lab 04/10/19 0630 04/11/19 0500  NA 135 138  K 3.8 2.8*  CL 98 96*  CO2 27 30  GLUCOSE 89 133*  BUN 19 24*  CREATININE 1.08 1.22  CALCIUM 7.6* 8.2*  MG  --  1.8   GFR: Estimated Creatinine Clearance: 37.2 mL/min (by C-G formula based on SCr of 1.22 mg/dL). Liver Function Tests: Recent Labs  Lab 04/10/19 0630 04/11/19 0500  AST 13* 11*  ALT 12 13  ALKPHOS 57 66  BILITOT 0.5 1.3*  PROT 5.7* 6.5  ALBUMIN 2.3* 2.6*   No results for input(s): LIPASE, AMYLASE in the last 168 hours. No results for input(s): AMMONIA in the last 168 hours. Coagulation Profile: No results for input(s): INR, PROTIME in the last 168 hours. Cardiac Enzymes: No  results for input(s): CKTOTAL, CKMB, CKMBINDEX, TROPONINI in the last 168 hours. BNP (last 3 results) No results for input(s): PROBNP in the last 8760 hours. HbA1C: No results for input(s): HGBA1C in the last 72 hours. CBG: Recent Labs  Lab 04/10/19 0747 04/10/19 1146 04/10/19 1649 04/10/19 2136 04/11/19 0740  GLUCAP 85 110* 119* 122* 107*   Lipid Profile: Recent Labs    04/10/19 0630  CHOL 93  HDL 27*  LDLCALC 52  TRIG 69  CHOLHDL 3.4   Thyroid Function Tests: Recent Labs    04/10/19 0630  TSH 0.625   Anemia Panel: Recent Labs    04/10/19 0630  VITAMINB12 276  FERRITIN 73  TIBC 243*  IRON 8*   Urine analysis: No results found for: COLORURINE, APPEARANCEUR, LABSPEC, PHURINE, GLUCOSEU, HGBUR, BILIRUBINUR, KETONESUR, PROTEINUR, UROBILINOGEN, NITRITE, LEUKOCYTESUR Sepsis Labs: @LABRCNTIP (procalcitonin:4,lacticidven:4)  ) Recent Results (from the past 240 hour(s))  MRSA PCR Screening     Status: None   Collection Time: 04/10/19  4:36 AM   Specimen: Nasal Mucosa; Nasopharyngeal  Result Value Ref Range Status   MRSA by PCR  NEGATIVE NEGATIVE Final    Comment:        The GeneXpert MRSA Assay (FDA approved for NASAL specimens only), is one component of a comprehensive MRSA colonization surveillance program. It is not intended to diagnose MRSA infection nor to guide or monitor treatment for MRSA infections. Performed at [facility], [address], [place], [place] [address]          Radiology Studies: Ct Chest W Contrast  Result Date: 04/10/2019 CLINICAL DATA:  73 year old male with leukocytosis and possible mass on outside CT. EXAM: CT CHEST WITH CONTRAST TECHNIQUE: Multidetector CT imaging of the chest was performed during intravenous contrast administration. CONTRAST:  75mL OMNIPAQUE IOHEXOL 300 MG/ML  SOLN COMPARISON:  Outside films could not be obtained for comparison. FINDINGS: Cardiovascular: Heart size is normal. Moderate  atherosclerotic plaque in the transverse descending aorta noted with slight irregularity. A moderate amount of atherosclerotic plaque within the proximal LEFT subclavian artery noted with mild irregularity. There is no evidence of thoracic aortic aneurysm or dissection. Coronary artery atherosclerotic calcifications are present. No pericardial effusion noted. Mediastinum/Nodes: Mildly enlarged RIGHT mediastinal lymph nodes are present, with the largest nodes measuring 1.2 cm in short axis (series 2: Image 35 and 2:54). No other enlarged lymph nodes are identified. Lungs/Pleura: Moderate centrilobular emphysema is noted. A 2.9 x 6.3 cm opacity involving the anteromedial RIGHT UPPER lobe is noted with areas of internal low-density. This has a large contact surface area within RIGHT mediastinum. A 0.7 cm RIGHT middle lobe ground-glass nodule (5:78) and a 1.1 cm RIGHT LOWER lobe irregular opacity/nodule (5:105) are noted. A moderate LEFT pleural effusion is noted with moderate LEFT LOWER lung atelectasis. No definite pleural enhancement or nodularity. Upper Abdomen: No acute abnormality. Gastric band is noted. Musculoskeletal: No acute or suspicious bony abnormalities are identified. Compression fractures of T12 (50%) and L1 (30%) are age indeterminate but do not appear acute. Correlate clinically. IMPRESSION: 1. Indeterminate 2.9 x 6.3 cm anteromedial RIGHT UPPER lobe opacity with mildly enlarged RIGHT mediastinal lymph nodes. This is suspicious for malignancy but could also represent infection. 2. 1.1 cm RIGHT middle lobe ground-glass nodule and 1.1 cm RIGHT LOWER lobe irregular opacity/nodule. Consider one of the following in 3 months for both low-risk and high-risk individuals: (a) repeat chest CT, (b) follow-up PET-CT, or (c) tissue sampling. This recommendation follows the consensus statement: Guidelines for Management of Incidental Pulmonary Nodules Detected on CT Images: From the Fleischner Society 2017;  Radiology 2017; 284:228-243. 3. Moderate LEFT pleural effusion with moderate LEFT LOWER lung atelectasis. 4. Coronary artery disease. 5. Aortic Atherosclerosis (ICD10-I70.0) and Emphysema (ICD10-J43.9). Electronically Signed   By: Harmon Pier M.D.   On: 04/10/2019 13:12   Dg Chest Port 1 View  Result Date: 04/10/2019 CLINICAL DATA:  73 year old male with questionable right upper lobe mass. EXAM: PORTABLE CHEST 1 VIEW COMPARISON:  None. FINDINGS: Right IJ central venous line with tip over the mediastinum likely over central SVC. Left lung base densities may represent atelectasis although pneumonia is not excluded. Clinical correlation is recommended. A trace left pleural effusion may be present. Bilateral perihilar densities may represent mild vascular congestion. The right lung base is clear. There is no pneumothorax. Atherosclerotic calcification of the aortic arch. Probable cerclage wire over the left hemidiaphragm. No acute osseous pathology. IMPRESSION: 1. No mass identified in the right upper lobe as clinically questioned. 2. Left lung base atelectasis versus infiltrate. Electronically Signed   By: Elgie Collard M.D.   On: 04/10/2019  12:22        Scheduled Meds: . sodium chloride   Intravenous Once  . aspirin EC  81 mg Oral Daily  . carvedilol  6.25 mg Oral BID WC  . Chlorhexidine Gluconate Cloth  6 each Topical Daily  . enoxaparin (LOVENOX) injection  40 mg Subcutaneous Q24H  . feeding supplement (ENSURE ENLIVE)  237 mL Oral TID BM  . ferrous sulfate  325 mg Oral BID WC  . furosemide  40 mg Oral BID  . influenza vaccine adjuvanted  0.5 mL Intramuscular Tomorrow-1000  . insulin aspart  0-5 Units Subcutaneous QHS  . insulin aspart  0-9 Units Subcutaneous TID WC  . lisinopril  5 mg Oral Daily  . mouth rinse  15 mL Mouth Rinse BID  . mirtazapine  30 mg Oral QHS  . mometasone-formoterol  2 puff Inhalation BID  . multivitamin with minerals  1 tablet Oral Daily  . pantoprazole  40 mg  Oral Daily  . pneumococcal 23 valent vaccine  0.5 mL Intramuscular Tomorrow-1000  . potassium chloride  40 mEq Oral Q4H  . sodium chloride flush  10-40 mL Intracatheter Q12H  . sodium chloride flush  3 mL Intravenous Q12H  . ticagrelor  90 mg Oral BID  . vitamin B-12  1,000 mcg Oral Daily   Continuous Infusions: . sodium chloride Stopped (04/10/19 1146)  . azithromycin Stopped (04/10/19 1145)  . cefTRIAXone (ROCEPHIN)  IV 1 g (04/11/19 0955)  . magnesium sulfate bolus IVPB       LOS: 1 day    Time spent: 35 minutes.   Dana Allan, MD  [facility] Pager #: [phone] 7PM-7AM contact night coverage as above

## 2019-04-11 NOTE — Progress Notes (Signed)
PT Cancellation Note  Patient Details Name: Eduardo Osborne MRN: [id number] DOB: Oct 13, 1945   Cancelled Treatment:    Reason Eval/Treat Not Completed: Medical issues which prohibited therapy(low BPs) RN requests to hold therapy today.  Will check back as schedule permits.   Isaiah Cianci,KATHrine E 04/11/2019, 2:13 PM Carmelia Bake, PT, DPT Acute Rehabilitation Services Office: [phone] Pager: [phone]

## 2019-04-11 NOTE — Procedures (Signed)
Thoracentesis Procedure Note  Pre-operative Diagnosis: Pleural effusion   Post-operative Diagnosis: same  Indications: pleural effusion   Procedure Details  Consent: Informed consent was obtained. Risks of the procedure were discussed including: infection, bleeding, pain, pneumothorax.  Under sterile conditions the patient was positioned. Betadine solution and sterile drapes were utilized.  1% buffered lidocaine was used to anesthetize the  rib space which was identified . Fluid was obtained without any difficulties and minimal blood loss.  A dressing was applied to the wound and wound care instructions were provided.   Findings 350 ml of clear pleural fluid was obtained. A sample was sent to Pathology,, and cell counts, as well as for infection analysis.  Complications:  None; patient tolerated the procedure well.          Condition: stable  Plan A follow up chest x-ray was ordered. Bed Rest for 0 hours.  Tylenol 650 mg. for pain.Erick Colace ACNP-BC [place] Pager # [phone] OR # [phone] if no answer

## 2019-04-11 NOTE — Progress Notes (Signed)
Patient unsatisfied with breakfast received, states he is leaving because he did not get what he wanted. When asked what he would like instead so that we could place an order, patient stated "just forget it, you got me mad now, I'm not taking any medications or anything else."  Education provided to patient that we could get what he wanted for breakfast, but we just need to know what he wants.  Will re-evaluate and attempt to administer medications when patient is cooperating.

## 2019-04-11 NOTE — Consult Note (Signed)
NAME:  Eduardo Osborne, MRN:  [id number], DOB:  February 06, 1946, LOS: 1 ADMISSION DATE:  04/10/2019, CONSULTATION DATE:  04/11/2019 REFERRING MD:  [facility] Dr Dartha Lodge, CHIEF COMPLAINT:  Breathing trouble   Brief History   Lung mass R and left pleural effusion consult  History of present illness   73 year old frail cachectic male.  Patient is almost nonexistent historian other than saying that he short of breath.  History is gathered from the review of the chart essentially.  Pulmonary has been consulted for the right upper lobe lung mass greater than 6 cm.  He has a history of coronary artery disease, atrial fibrillation, type 2 diabetes, cachexia, anemia not otherwise specified and COPD not otherwise specified.  It appears that he was admitted to an outside hospital for acute hypoxemic respiratory failure and was placed on BiPAP.  CT chest during this time showed a lung mass and therefore he was transferred here.  Pulmonary was consulted yesterday April 10, 2019.  However the CD-ROM that came with him did not have any images.  Therefore we repeated his his CT scan of the chest.  The main findings are a> 6 cm mass in the right upper lobe anteromedially abutting the mediastinum.  This appears necrotic.  This 1 cm right lower lobe nodule and a less than 1 cm right middle lobe nodule.  In addition there is a left [opposite side] sided effusion with atelectasis.  The effusion is small/moderate in size.  Patient himself is not giving me much of a history.  He is currently on community-acquired pneumonia treatment with IV antibiotics.  He is not on the ventilator.  He is not on pressors.  He is alert.  He seems grumpy when his breakfast is not served on time.  He is on dual antiplatelet therapy with Brilinta and aspirin.  He is also on DVT prophylaxis.  He appears to be on standard COPD treatment.  Past Medical History     has a past medical history of AAA (abdominal aortic aneurysm) (HCC) (04/06/2019), CAD  (coronary artery disease), COPD (chronic obstructive pulmonary disease) (HCC), Diabetes (HCC), Hypertension, and Lung mass (04/06/2019).   reports that he has been smoking cigarettes. He has never used smokeless tobacco.  Past Surgical History:  Procedure Laterality Date   APPENDECTOMY     CHOLECYSTECTOMY     CORONARY ANGIOPLASTY WITH STENT PLACEMENT      Allergies  Allergen Reactions   Aspirin Other (See Comments)    Questionable.. unk.. on paperwork   Penicillins Other (See Comments)    Unk.. on paperwork    There is no immunization history for the selected administration types on file for this patient.  Family History  Problem Relation Age of Onset   Heart attack Father      Current Facility-Administered Medications:    0.9 %  sodium chloride infusion (Manually program via Guardrails IV Fluids), , Intravenous, Once, Berton Mount I, MD   0.9 %  sodium chloride infusion, 250 mL, Intravenous, PRN, Pearson Grippe, MD, Stopped at 04/10/19 1146   acetaminophen (TYLENOL) tablet 650 mg, 650 mg, Oral, Q6H PRN **OR** acetaminophen (TYLENOL) suppository 650 mg, 650 mg, Rectal, Q6H PRN, Pearson Grippe, MD   albuterol (PROVENTIL) (2.5 MG/3ML) 0.083% nebulizer solution 3 mL, 3 mL, Inhalation, Q6H PRN, Pearson Grippe, MD   aspirin EC tablet 81 mg, 81 mg, Oral, Daily, Pearson Grippe, MD, 81 mg at 04/10/19 1017   azithromycin (ZITHROMAX) 500 mg in sodium chloride 0.9 % 250 mL IVPB,  500 mg, Intravenous, Q24H, Berton Mountgbata, Sylvester I, MD, Stopped at 04/10/19 1145   carvedilol (COREG) tablet 6.25 mg, 6.25 mg, Oral, BID WC, Pearson GrippeKim, James, MD, 6.25 mg at 04/10/19 1800   cefTRIAXone (ROCEPHIN) 1 g in sodium chloride 0.9 % 100 mL IVPB, 1 g, Intravenous, Q24H, Berton Mountgbata, Sylvester I, MD, Last Rate: 200 mL/hr at 04/11/19 0955, 1 g at 04/11/19 0955   Chlorhexidine Gluconate Cloth 2 % PADS 6 each, 6 each, Topical, Daily, Clydia Llanohandra, Aravind, MD   enoxaparin (LOVENOX) injection 40 mg, 40 mg, Subcutaneous, Q24H,  Pearson GrippeKim, James, MD, 40 mg at 04/10/19 1017   feeding supplement (ENSURE ENLIVE) (ENSURE ENLIVE) liquid 237 mL, 237 mL, Oral, TID BM, Berton Mountgbata, Sylvester I, MD, 237 mL at 04/10/19 1400   ferrous sulfate tablet 325 mg, 325 mg, Oral, BID WC, Pearson GrippeKim, James, MD, 325 mg at 04/10/19 1800   furosemide (LASIX) tablet 40 mg, 40 mg, Oral, BID, Pearson GrippeKim, James, MD, 40 mg at 04/10/19 1809   influenza vaccine adjuvanted (FLUAD) injection 0.5 mL, 0.5 mL, Intramuscular, Tomorrow-1000, Ogbata, Sylvester I, MD   insulin aspart (novoLOG) injection 0-5 Units, 0-5 Units, Subcutaneous, QHS, Pearson GrippeKim, James, MD   insulin aspart (novoLOG) injection 0-9 Units, 0-9 Units, Subcutaneous, TID WC, Pearson GrippeKim, James, MD   lisinopril (ZESTRIL) tablet 5 mg, 5 mg, Oral, Daily, Pearson GrippeKim, James, MD, 5 mg at 04/10/19 1017   magnesium sulfate IVPB 2 g 50 mL, 2 g, Intravenous, Once, Berton Mountgbata, Sylvester I, MD   MEDLINE mouth rinse, 15 mL, Mouth Rinse, BID, Pearson GrippeKim, James, MD, 15 mL at 04/10/19 2142   mirtazapine (REMERON) tablet 30 mg, 30 mg, Oral, QHS, Pearson GrippeKim, James, MD, 30 mg at 04/10/19 2143   mometasone-formoterol (DULERA) 200-5 MCG/ACT inhaler 2 puff, 2 puff, Inhalation, BID, Pearson GrippeKim, James, MD, 2 puff at 04/11/19 0750   multivitamin with minerals tablet 1 tablet, 1 tablet, Oral, Daily, Berton Mountgbata, Sylvester I, MD   ondansetron [facility](ZOFRAN) injection 4 mg, 4 mg, Intravenous, Q6H PRN, Pearson GrippeKim, James, MD   pantoprazole (PROTONIX) EC tablet 40 mg, 40 mg, Oral, Daily, Pearson GrippeKim, James, MD, 40 mg at 04/10/19 1017   pneumococcal 23 valent vaccine (PNU-IMMUNE) injection 0.5 mL, 0.5 mL, Intramuscular, Tomorrow-1000, Ogbata, Sylvester I, MD   potassium chloride SA (KLOR-CON) CR tablet 40 mEq, 40 mEq, Oral, Q4H, Ogbata, Sylvester I, MD   sodium chloride flush (NS) 0.9 % injection 10-40 mL, 10-40 mL, Intracatheter, Q12H, Berton Mountgbata, Sylvester I, MD, 10 mL at 04/10/19 2142   sodium chloride flush (NS) 0.9 % injection 10-40 mL, 10-40 mL, Intracatheter, PRN, Berton Mountgbata, Sylvester I, MD   sodium  chloride flush (NS) 0.9 % injection 3 mL, 3 mL, Intravenous, Q12H, Pearson GrippeKim, James, MD, 3 mL at 04/10/19 2143   sodium chloride flush (NS) 0.9 % injection 3 mL, 3 mL, Intravenous, PRN, Pearson GrippeKim, James, MD   ticagrelor Marden Noble(BRILINTA) tablet 90 mg, 90 mg, Oral, BID, Pearson GrippeKim, James, MD, 90 mg at 04/10/19 2143   vitamin B-12 (CYANOCOBALAMIN) tablet 1,000 mcg, 1,000 mcg, Oral, Daily, Berton Mountgbata, Sylvester I, MD, 1,000 mcg at 04/10/19 1025    Significant Hospital Events   04/10/2019 - admit 04/11/2019 - consult  Consults:  11/6 - pulm consult  Procedures:  11/6 - planned thora  Significant Diagnostic Tests:  x  Micro Data:   Results for orders placed or performed during the hospital encounter of 04/10/19  MRSA PCR Screening     Status: None   Collection Time: 04/10/19  4:36 AM   Specimen: Nasal Mucosa; Nasopharyngeal  Result Value Ref  Range Status   MRSA by PCR NEGATIVE NEGATIVE Final    Comment:        The GeneXpert MRSA Assay (FDA approved for NASAL specimens only), is one component of a comprehensive MRSA colonization surveillance program. It is not intended to diagnose MRSA infection nor to guide or monitor treatment for MRSA infections. Performed at [facility], [address], [place], [place] [address]      Antimicrobials:   Anti-infectives (From admission, onward)   Start     Dose/Rate Route Frequency Ordered Stop   04/10/19 1000  cefTRIAXone (ROCEPHIN) 1 g in sodium chloride 0.9 % 100 mL IVPB     1 g 200 mL/hr over 30 Minutes Intravenous Every 24 hours 04/10/19 0901     04/10/19 1000  azithromycin (ZITHROMAX) 500 mg in sodium chloride 0.9 % 250 mL IVPB     500 mg 250 mL/hr over 60 Minutes Intravenous Every 24 hours 04/10/19 0901          Interim history/subjective:  x  Objective   Blood pressure 127/60, pulse 87, temperature 98 F (36.7 C), temperature source Oral, resp. rate (!) 21, height  (1.803 m), weight 50.3 kg, SpO2 97 %.         Intake/Output Summary (Last 24 hours) at 04/11/2019 1027 Last data filed at 04/11/2019 0439 Gross per 24 hour  Intake 1182.29 ml  Output 2330 ml  Net -1147.71 ml   Filed Weights   04/10/19 0536 04/11/19 0500  Weight: 50.3 kg 50.3 kg    Examination: General: very frail, alert and sitting in bed HENT: no neck nodes, no elevated JVP Lungs: LEft lung reduced percussion, dullness and reduced air entry Cardiovascular:  Normal heart sounds Abdomen: Soft. Non tender Extremities: intact Neuro: alert, oriented but poor historian GU: not examined   Ct Chest W Contrast  Result Date: 04/10/2019 CLINICAL DATA:  73 year old male with leukocytosis and possible mass on outside CT. EXAM: CT CHEST WITH CONTRAST TECHNIQUE: Multidetector CT imaging of the chest was performed during intravenous contrast administration. CONTRAST:  75mL OMNIPAQUE IOHEXOL 300 MG/ML  SOLN COMPARISON:  Outside films could not be obtained for comparison. FINDINGS: Cardiovascular: Heart size is normal. Moderate atherosclerotic plaque in the transverse descending aorta noted with slight irregularity. A moderate amount of atherosclerotic plaque within the proximal LEFT subclavian artery noted with mild irregularity. There is no evidence of thoracic aortic aneurysm or dissection. Coronary artery atherosclerotic calcifications are present. No pericardial effusion noted. Mediastinum/Nodes: Mildly enlarged RIGHT mediastinal lymph nodes are present, with the largest nodes measuring 1.2 cm in short axis (series 2: Image 35 and 2:54). No other enlarged lymph nodes are identified. Lungs/Pleura: Moderate centrilobular emphysema is noted. A 2.9 x 6.3 cm opacity involving the anteromedial RIGHT UPPER lobe is noted with areas of internal low-density. This has a large contact surface area within RIGHT mediastinum. A 0.7 cm RIGHT middle lobe ground-glass nodule (5:78) and a 1.1 cm RIGHT LOWER lobe irregular opacity/nodule (5:105) are noted. A  moderate LEFT pleural effusion is noted with moderate LEFT LOWER lung atelectasis. No definite pleural enhancement or nodularity. Upper Abdomen: No acute abnormality. Gastric band is noted. Musculoskeletal: No acute or suspicious bony abnormalities are identified. Compression fractures of T12 (50%) and L1 (30%) are age indeterminate but do not appear acute. Correlate clinically. IMPRESSION: 1. Indeterminate 2.9 x 6.3 cm anteromedial RIGHT UPPER lobe opacity with mildly enlarged RIGHT mediastinal lymph nodes. This is suspicious for malignancy but could also represent infection.  2. 1.1 cm RIGHT middle lobe ground-glass nodule and 1.1 cm RIGHT LOWER lobe irregular opacity/nodule. Consider one of the following in 3 months for both low-risk and high-risk individuals: (a) repeat chest CT, (b) follow-up PET-CT, or (c) tissue sampling. This recommendation follows the consensus statement: Guidelines for Management of Incidental Pulmonary Nodules Detected on CT Images: From the Fleischner Society 2017; Radiology 2017; 284:228-243. 3. Moderate LEFT pleural effusion with moderate LEFT LOWER lung atelectasis. 4. Coronary artery disease. 5. Aortic Atherosclerosis (ICD10-I70.0) and Emphysema (ICD10-J43.9). Electronically Signed   By: Harmon Pier M.D.   On: 04/10/2019 13:12   Dg Chest Port 1 View  Result Date: 04/10/2019 CLINICAL DATA:  73 year old male with questionable right upper lobe mass. EXAM: PORTABLE CHEST 1 VIEW COMPARISON:  None. FINDINGS: Right IJ central venous line with tip over the mediastinum likely over central SVC. Left lung base densities may represent atelectasis although pneumonia is not excluded. Clinical correlation is recommended. A trace left pleural effusion may be present. Bilateral perihilar densities may represent mild vascular congestion. The right lung base is clear. There is no pneumothorax. Atherosclerotic calcification of the aortic arch. Probable cerclage wire over the left hemidiaphragm. No  acute osseous pathology. IMPRESSION: 1. No mass identified in the right upper lobe as clinically questioned. 2. Left lung base atelectasis versus infiltrate. Electronically Signed   By: Elgie Collard M.D.   On: 04/10/2019 12:22     Resolved Hospital Problem list   x  Assessment & Plan:  Right lung mass - nectrotic 6.3cm:   -  location of mass is very difficult to access by either bronch and risky by CT guided TTNA; - get PET scan as opd to better understand the mass and locations to biopsy for answers   RML and RLL nodule and mediasinal nodes  - possibly amenble by bronch but needs PET scan first and preferrred bronch technique is EBUS and ENB technique; all of this is outpatient  - these might be good surrogates to get to understanding mass above but need PET scan as opd   Left pleural effusion  -  likely unrelated to above but possible there is a relationship  Plan - get echo  -- diagnostic thoracentesis 04/11/2019  (he is on DAPT + lovenox)  - will have to explain Risks of pneumothorax, hemothorax, sedation/anesthesia complications such as cardiac or respiratory arrest or hypotension, stroke and bleeding all explained. Benefits of diagnosis but limitations of non-diagnosis also explained. Patient will need to verbalize understanding and wished to proceed.    Emphysema  - per riatd  Best practice:  Per [facility]    LABS    PULMONARY No results for input(s): PHART, PCO2ART, PO2ART, HCO3, TCO2, O2SAT in the last 168 hours.  Invalid input(s): PCO2, PO2  CBC Recent Labs  Lab 04/10/19 0630 04/10/19 1015 04/10/19 2030 04/11/19 0500  HGB 6.5* 6.6* 8.3* 8.9*  HCT 20.4* 21.3* 26.1* 28.5*  WBC 16.4* 17.1*  --  27.9*  PLT 383 394  --  492*    COAGULATION No results for input(s): INR in the last 168 hours.  CARDIAC  No results for input(s): TROPONINI in the last 168 hours. No results for input(s): PROBNP in the last 168 hours.   CHEMISTRY Recent Labs  Lab  04/10/19 0630 04/11/19 0500  NA 135 138  K 3.8 2.8*  CL 98 96*  CO2 27 30  GLUCOSE 89 133*  BUN 19 24*  CREATININE 1.08 1.22  CALCIUM 7.6* 8.2*  MG  --  1.8   Estimated Creatinine Clearance: 37.2 mL/min (by C-G formula based on SCr of 1.22 mg/dL).   LIVER Recent Labs  Lab 04/10/19 0630 04/11/19 0500  AST 13* 11*  ALT 12 13  ALKPHOS 57 66  BILITOT 0.5 1.3*  PROT 5.7* 6.5  ALBUMIN 2.3* 2.6*     INFECTIOUS Recent Labs  Lab 04/10/19 1015  PROCALCITON 0.91     ENDOCRINE CBG (last 3)  Recent Labs    04/10/19 1649 04/10/19 2136 04/11/19 0740  GLUCAP 119* 122* 107*         IMAGING x48h  - image(s) personally visualized  -   highlighted in bold Ct Chest W Contrast  Result Date: 04/10/2019 CLINICAL DATA:  73 year old male with leukocytosis and possible mass on outside CT. EXAM: CT CHEST WITH CONTRAST TECHNIQUE: Multidetector CT imaging of the chest was performed during intravenous contrast administration. CONTRAST:  85mL OMNIPAQUE IOHEXOL 300 MG/ML  SOLN COMPARISON:  Outside films could not be obtained for comparison. FINDINGS: Cardiovascular: Heart size is normal. Moderate atherosclerotic plaque in the transverse descending aorta noted with slight irregularity. A moderate amount of atherosclerotic plaque within the proximal LEFT subclavian artery noted with mild irregularity. There is no evidence of thoracic aortic aneurysm or dissection. Coronary artery atherosclerotic calcifications are present. No pericardial effusion noted. Mediastinum/Nodes: Mildly enlarged RIGHT mediastinal lymph nodes are present, with the largest nodes measuring 1.2 cm in short axis (series 2: Image 35 and 2:54). No other enlarged lymph nodes are identified. Lungs/Pleura: Moderate centrilobular emphysema is noted. A 2.9 x 6.3 cm opacity involving the anteromedial RIGHT UPPER lobe is noted with areas of internal low-density. This has a large contact surface area within RIGHT mediastinum. A 0.7 cm  RIGHT middle lobe ground-glass nodule (5:78) and a 1.1 cm RIGHT LOWER lobe irregular opacity/nodule (5:105) are noted. A moderate LEFT pleural effusion is noted with moderate LEFT LOWER lung atelectasis. No definite pleural enhancement or nodularity. Upper Abdomen: No acute abnormality. Gastric band is noted. Musculoskeletal: No acute or suspicious bony abnormalities are identified. Compression fractures of T12 (50%) and L1 (30%) are age indeterminate but do not appear acute. Correlate clinically. IMPRESSION: 1. Indeterminate 2.9 x 6.3 cm anteromedial RIGHT UPPER lobe opacity with mildly enlarged RIGHT mediastinal lymph nodes. This is suspicious for malignancy but could also represent infection. 2. 1.1 cm RIGHT middle lobe ground-glass nodule and 1.1 cm RIGHT LOWER lobe irregular opacity/nodule. Consider one of the following in 3 months for both low-risk and high-risk individuals: (a) repeat chest CT, (b) follow-up PET-CT, or (c) tissue sampling. This recommendation follows the consensus statement: Guidelines for Management of Incidental Pulmonary Nodules Detected on CT Images: From the Fleischner Society 2017; Radiology 2017; 284:228-243. 3. Moderate LEFT pleural effusion with moderate LEFT LOWER lung atelectasis. 4. Coronary artery disease. 5. Aortic Atherosclerosis (ICD10-I70.0) and Emphysema (ICD10-J43.9). Electronically Signed   By: Harmon Pier M.D.   On: 04/10/2019 13:12   Dg Chest Port 1 View  Result Date: 04/10/2019 CLINICAL DATA:  73 year old male with questionable right upper lobe mass. EXAM: PORTABLE CHEST 1 VIEW COMPARISON:  None. FINDINGS: Right IJ central venous line with tip over the mediastinum likely over central SVC. Left lung base densities may represent atelectasis although pneumonia is not excluded. Clinical correlation is recommended. A trace left pleural effusion may be present. Bilateral perihilar densities may represent mild vascular congestion. The right lung base is clear. There is no  pneumothorax. Atherosclerotic calcification of the aortic arch. Probable cerclage wire over the left  hemidiaphragm. No acute osseous pathology. IMPRESSION: 1. No mass identified in the right upper lobe as clinically questioned. 2. Left lung base atelectasis versus infiltrate. Electronically Signed   By: Anner Crete M.D.   On: 04/10/2019 12:22

## 2019-04-12 LAB — GLUCOSE, CAPILLARY
Glucose-Capillary: 92 mg/dL (ref 70–99)
Glucose-Capillary: 133 mg/dL — ABNORMAL HIGH (ref 70–99)
Glucose-Capillary: 119 mg/dL — ABNORMAL HIGH (ref 70–99)
Glucose-Capillary: 127 mg/dL — ABNORMAL HIGH (ref 70–99)

## 2019-04-12 LAB — RENAL FUNCTION PANEL
Albumin: 2.3 g/dL — ABNORMAL LOW (ref 3.5–5.0)
Anion gap: 10 (ref 5–15)
BUN: 25 mg/dL — ABNORMAL HIGH (ref 8–23)
CO2: 29 mmol/L (ref 22–32)
Calcium: 8.2 mg/dL — ABNORMAL LOW (ref 8.9–10.3)
Chloride: 103 mmol/L (ref 98–111)
Creatinine, Ser: 1.34 mg/dL — ABNORMAL HIGH (ref 0.61–1.24)
GFR calc Af Amer: 60 mL/min — ABNORMAL LOW (ref >60)
GFR calc non Af Amer: 51 mL/min — ABNORMAL LOW (ref >60)
Glucose, Bld: 99 mg/dL (ref 70–99)
Phosphorus: 2.9 mg/dL (ref 2.5–4.6)
Potassium: 4 mmol/L (ref 3.5–5.1)
Sodium: 142 mmol/L (ref 135–145)

## 2019-04-12 LAB — CBC WITH DIFFERENTIAL/PLATELET
Abs Immature Granulocytes: 0.22 10*3/uL — ABNORMAL HIGH (ref 0.00–0.07)
Basophils Absolute: 0.1 10*3/uL (ref 0.0–0.1)
Basophils Relative: 0 %
Eosinophils Absolute: 2.1 10*3/uL — ABNORMAL HIGH (ref 0.0–0.5)
Eosinophils Relative: 13 %
HCT: 26.4 % — ABNORMAL LOW (ref 39.0–52.0)
Hemoglobin: 8.1 g/dL — ABNORMAL LOW (ref 13.0–17.0)
Immature Granulocytes: 1 %
Lymphocytes Relative: 9 %
Lymphs Abs: 1.5 10*3/uL (ref 0.7–4.0)
MCH: 27.6 pg (ref 26.0–34.0)
MCHC: 30.7 g/dL (ref 30.0–36.0)
MCV: 90.1 fL (ref 80.0–100.0)
Monocytes Absolute: 0.9 10*3/uL (ref 0.1–1.0)
Monocytes Relative: 6 %
Neutro Abs: 11.5 10*3/uL — ABNORMAL HIGH (ref 1.7–7.7)
Neutrophils Relative %: 71 %
Platelets: 512 10*3/uL — ABNORMAL HIGH (ref 150–400)
RBC: 2.93 MIL/uL — ABNORMAL LOW (ref 4.22–5.81)
RDW: 14.7 % (ref 11.5–15.5)
WBC: 16.3 10*3/uL — ABNORMAL HIGH (ref 4.0–10.5)
nRBC: 0 % (ref 0.0–0.2)

## 2019-04-12 LAB — MAGNESIUM: Magnesium: 2.3 mg/dL (ref 1.7–2.4)

## 2019-04-12 LAB — PROCALCITONIN: Procalcitonin: 0.41 ng/mL

## 2019-04-12 MED ORDER — AZITHROMYCIN 250 MG PO TABS
500.0000 mg | ORAL_TABLET | Freq: Every day | ORAL | Status: DC
  Administered 2019-04-13 – 2019-04-15 (×3): 500 mg via ORAL
  Filled 2019-04-12 (×3): qty 2

## 2019-04-12 NOTE — Progress Notes (Signed)
PROGRESS NOTE    Eduardo Osborne  UJW:[id number] DOB: Dec 01, 1945 DOA: 04/10/2019 PCP: Garvin Fila, MD  Outpatient Specialists:   Brief Narrative:  Patient is a 73 year old Caucasian male, cachectic, disheveled, possibly failing to thrive, with past medical history significant for coronary artery disease, atrial fibrillation, diabetes mellitus type 2, and anemia, COPD on home oxygen, 60 pack years and history of 8 mm of lung mass also 07/22/2018.  Patient was admitted to [facility] with greater than 6.9 cm right upper lobe lung mass, possible pneumonia/sepsis.  Patient was transferred to [facility] for further management of lung mass.  Pulmonary team has been consulted to assist with patient's management.  Significant leukocytosis is noted.  CT chest with contrast done in this hospital on 04/10/2019 revealed indeterminate 2.9 x 6.3 cm anterior medial right upper lobe opacity with mildly enlarged right mediastinal lymph node, said to be suspicious for malignancy but infection could not be ruled out.  1.1 cm right middle lobe groundglass nodule and 1.1 cm right lower lobe irregular opacity was also recommended.  Moderate left-sided effusion was reported with moderate left lower lobe atelectasis.  Findings suggestive of coronary artery disease and aortic atherosclerosis were noted.  Pulmonary team is assisting with patient's care.  Thoracentesis is planned today and pleural fluid will be sent for analysis.  Patient has had intermittent episodes of hypotension that have responded to volume resuscitation.  Further management will depend on hospital course.  04/12/2019: Patient seen.  Patient is more stable today.  Pleural fluid analysis noted, likely exudative based on light criteria.  Follow final pleural fluid results.  Discussed with pulmonary team, input is highly appreciated (for possible EBUS).  Assessment & Plan:   Principal Problem:   Lung mass Active  Problems:   COPD (chronic obstructive pulmonary disease) (HCC)   Protein-calorie malnutrition, severe   S/P thoracentesis   Pleural effusion  Right upper lobe lung mass:  -Repeat CT chest is as documented above.   -Pulmonary input is highly appreciated, for possible EBUS.   -Patient is status post thoracentesis, pleural fluid analysis reviewed.   -Patient has more than 60 pack years. -Patient has been pancultured as well.   -Continue IV antibiotics for now. -Follow final culture results.     -Further management will depend on hospital course.    Possible pneumonia/SIRS/sepsis: -Follow cultures.   -Patient is currently on IV ceftriaxone and azithromycin.   -Procalcitonin checked earlier was 0.91.   -Intermittent episodes of hypotension has been documented.   -Further management will depend on hospital course.   -Repeat procalcitonin done earlier today is improving, 0.41.   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  Compression fracture  T12, new mild naterior compression fracture T11 Check vitamin D 25-oh -TSH is 0.625.,  CAD s/p DES Cont Aspirin. Cont Brillinta. Cont Carvedilol (If blood pressure allows). Cont  Lisinopril (if blood pressure allows).  Gerd Cont PPI  Anemia: -Iron level is 8 -Ferritin is 73 -TIBC is 243 -MCV is normal. -Cannot rule out anemia of chronic inflammation versus anemia secondary to combined iron deficiency and chronic inflammation. -RBC folate level is 1328 -Vitamin B12 is 276. -Fecal occult blood is pending. -Patient has been transfused with 1 unit of packed red blood cells. -Hemoglobin today is 8.9 g/dL (hemoglobin was 6.6 g/dL on 56/21/3086)  Severe malnutrition/weight loss/failure to thrive: Prealbumin is 8.9. Albumin is 2.6. Dietary consult. Patient has good appetite. BMI is 15.47  kg/m.  DVT prophylaxis: Subcutaneous Lovenox Code Status: Full code Family Communication:   Disposition Plan: This will depend on hospital course   Consultants:   Pulmonary/critical care team  Procedures:   Thoracentesis is planned today  Antimicrobials:   IV ceftriaxone  IV azithromycin   Subjective: Patient is a poor historian No fever or chills. No chest pain or shortness of breath No significant cough or phlegm production.  Objective: Vitals:   04/12/19 0500 04/12/19 0600 04/12/19 0800 04/12/19 0859  BP: (!) 94/46 (!) 100/54  (!) 115/53  Pulse: 70 68  75  Resp: (!) 23 (!) 23  16  Temp:   98.3 F (36.8 C)   TempSrc:   Oral   SpO2: 96% 96%  94%  Weight:      Height:        Intake/Output Summary (Last 24 hours) at 04/12/2019 1044 Last data filed at 04/12/2019 0500 Gross per 24 hour  Intake 990.18 ml  Output 600 ml  Net 390.18 ml   Filed Weights   04/10/19 0536 04/11/19 0500  Weight: 50.3 kg 50.3 kg    Examination:  General exam: Appears cachectic and disheveled. Respiratory system: Decreased air entry. Cardiovascular system: S1 & S2 heard Gastrointestinal system: Abdomen is nondistended, soft and nontender. No organomegaly or masses felt. Normal bowel sounds heard. Central nervous system: Alert and oriented.  Patient moves all extremities.   Extremities: No leg edema.  Data Reviewed: I have personally reviewed following labs and imaging studies  CBC: Recent Labs  Lab 04/10/19 0630 04/10/19 1015 04/10/19 2030 04/11/19 0500 04/12/19 0500  WBC 16.4* 17.1*  --  27.9* 16.3*  NEUTROABS  --   --   --  22.5* 11.5*  HGB 6.5* 6.6* 8.3* 8.9* 8.1*  HCT 20.4* 21.3*   19.5* 26.1* 28.5* 26.4*  MCV 87.2 87.3  --  86.9 90.1  PLT 383 394  --  492* 512*   Basic Metabolic Panel: Recent Labs  Lab 04/10/19 0630 04/11/19 0500 04/11/19 1807 04/12/19 0500  NA 135 138 141 142  K 3.8 2.8* 3.5 4.0  CL 98 96* 102 103  CO2 27 30 30 29   GLUCOSE 89 133* 104* 99  BUN 19 24* 29* 25*  CREATININE 1.08 1.22 1.37* 1.34*  CALCIUM 7.6* 8.2* 7.7* 8.2*  MG   --  1.8  --  2.3  PHOS  --   --   --  2.9   GFR: Estimated Creatinine Clearance: 33.9 mL/min (A) (by C-G formula based on SCr of 1.34 mg/dL (H)). Liver Function Tests: Recent Labs  Lab 04/10/19 0630 04/11/19 0500 04/11/19 1250 04/12/19 0500  AST 13* 11*  --   --   ALT 12 13  --   --   ALKPHOS 57 66  --   --   BILITOT 0.5 1.3*  --   --   PROT 5.7* 6.5 6.0*  --   ALBUMIN 2.3* 2.6*  --  2.3*   No results for input(s): LIPASE, AMYLASE in the last 168 hours. No results for input(s): AMMONIA in the last 168 hours. Coagulation Profile: No results for input(s): INR, PROTIME in the last 168 hours. Cardiac Enzymes: No results for input(s): CKTOTAL, CKMB, CKMBINDEX, TROPONINI in the last 168 hours. BNP (last 3 results) No results for input(s): PROBNP in the last 8760 hours. HbA1C: No results for input(s): HGBA1C in the last 72 hours. CBG: Recent Labs  Lab 04/11/19 0740 04/11/19 1140 04/11/19 1637 04/11/19 2145 04/12/19 13/07/20  GLUCAP 107* 182* 232* 139* 92   Lipid Profile: Recent Labs    04/10/19 0630 04/11/19 1250  CHOL 93 100  HDL 27*  --   LDLCALC 52  --   TRIG 69  --   CHOLHDL 3.4  --    Thyroid Function Tests: Recent Labs    04/10/19 0630  TSH 0.625   Anemia Panel: Recent Labs    04/10/19 0630  VITAMINB12 276  FERRITIN 73  TIBC 243*  IRON 8*   Urine analysis: No results found for: COLORURINE, APPEARANCEUR, LABSPEC, PHURINE, GLUCOSEU, HGBUR, BILIRUBINUR, KETONESUR, PROTEINUR, UROBILINOGEN, NITRITE, LEUKOCYTESUR Sepsis Labs: (procalcitonin:4,lacticidven:4)  ) Recent Results (from the past 240 hour(s))  MRSA PCR Screening     Status: None   Collection Time: 04/10/19  4:36 AM   Specimen: Nasal Mucosa; Nasopharyngeal  Result Value Ref Range Status   MRSA by PCR NEGATIVE NEGATIVE Final    Comment:        The GeneXpert MRSA Assay (FDA approved for NASAL specimens only), is one component of a comprehensive MRSA colonization surveillance  program. It is not intended to diagnose MRSA infection nor to guide or monitor treatment for MRSA infections. Performed at [facility], [address] [place], [place] [address]   Body fluid culture     Status: None (Preliminary result)   Collection Time: 04/11/19 11:31 AM   Specimen: Thoracentesis; Body Fluid  Result Value Ref Range Status   Specimen Description   Final    THORACENTESIS Performed at [facility], [address] [place], [place] [address]    Special Requests   Final    Normal Performed at [facility], [address], [place], [place] [address]    Gram Stain   Final    MODERATE WBC PRESENT, PREDOMINANTLY PMN NO ORGANISMS SEEN    Culture   Final    NO GROWTH < 24 HOURS Performed at [facility], [address], [place], [place] [address]    Report Status PENDING  Incomplete         Radiology Studies: Ct Chest W Contrast  Result Date: 04/10/2019 CLINICAL DATA:  73 year old male with leukocytosis and possible mass on outside CT. EXAM: CT CHEST WITH CONTRAST TECHNIQUE: Multidetector CT imaging of the chest was performed during intravenous contrast administration. CONTRAST:  75mL OMNIPAQUE IOHEXOL 300 MG/ML  SOLN COMPARISON:  Outside films could not be obtained for comparison. FINDINGS: Cardiovascular: Heart size is normal. Moderate atherosclerotic plaque in the transverse descending aorta noted with slight irregularity. A moderate amount of atherosclerotic plaque within the proximal LEFT subclavian artery noted with mild irregularity. There is no evidence of thoracic aortic aneurysm or dissection. Coronary artery atherosclerotic calcifications are present. No pericardial effusion noted. Mediastinum/Nodes: Mildly enlarged RIGHT mediastinal lymph nodes are present, with the largest nodes measuring 1.2 cm in short axis (series 2: Image 35 and 2:54). No other enlarged lymph nodes are identified.  Lungs/Pleura: Moderate centrilobular emphysema is noted. A 2.9 x 6.3 cm opacity involving the anteromedial RIGHT UPPER lobe is noted with areas of internal low-density. This has a large contact surface area within RIGHT mediastinum. A 0.7 cm RIGHT middle lobe ground-glass nodule (5:78) and a 1.1 cm RIGHT LOWER lobe irregular opacity/nodule (5:105) are noted. A moderate LEFT pleural effusion is noted with moderate LEFT LOWER lung atelectasis. No definite pleural enhancement or nodularity. Upper Abdomen: No acute abnormality. Gastric band is noted. Musculoskeletal: No acute or suspicious bony abnormalities are  identified. Compression fractures of T12 (50%) and L1 (30%) are age indeterminate but do not appear acute. Correlate clinically. IMPRESSION: 1. Indeterminate 2.9 x 6.3 cm anteromedial RIGHT UPPER lobe opacity with mildly enlarged RIGHT mediastinal lymph nodes. This is suspicious for malignancy but could also represent infection. 2. 1.1 cm RIGHT middle lobe ground-glass nodule and 1.1 cm RIGHT LOWER lobe irregular opacity/nodule. Consider one of the following in 3 months for both low-risk and high-risk individuals: (a) repeat chest CT, (b) follow-up PET-CT, or (c) tissue sampling. This recommendation follows the consensus statement: Guidelines for Management of Incidental Pulmonary Nodules Detected on CT Images: From the Fleischner Society 2017; Radiology 2017; 284:228-243. 3. Moderate LEFT pleural effusion with moderate LEFT LOWER lung atelectasis. 4. Coronary artery disease. 5. Aortic Atherosclerosis (ICD10-I70.0) and Emphysema (ICD10-J43.9). Electronically Signed   By: Harmon PierJeffrey  Hu M.D.   On: 04/10/2019 13:12   Dg Chest Port 1 View  Result Date: 04/11/2019 CLINICAL DATA:  Post thoracentesis. EXAM: PORTABLE CHEST 1 VIEW COMPARISON:  Radiographs and CT 04/10/2019. FINDINGS: 1210 hours. The left pleural effusion and aeration of the left lung base appear improved following presumed left-sided thoracentesis.  There is no pneumothorax. Known opacity medially in the right upper lobe is not well visualized on this study. The right lung is otherwise clear. The heart size and mediastinal contours are stable with diffuse aortic atherosclerosis. Right IJ central venous catheter extends to the level of the superior cavoatrial junction. IMPRESSION: 1. No evidence of pneumothorax following thoracentesis. Residual pleural effusion and aeration of the left lung base appear improved. 2. Known right upper lobe mass/infiltrate is not well visualized. Electronically Signed   By: Carey BullocksWilliam  Veazey M.D.   On: 04/11/2019 12:57   Dg Chest Port 1 View  Result Date: 04/10/2019 CLINICAL DATA:  73 year old male with questionable right upper lobe mass. EXAM: PORTABLE CHEST 1 VIEW COMPARISON:  None. FINDINGS: Right IJ central venous line with tip over the mediastinum likely over central SVC. Left lung base densities may represent atelectasis although pneumonia is not excluded. Clinical correlation is recommended. A trace left pleural effusion may be present. Bilateral perihilar densities may represent mild vascular congestion. The right lung base is clear. There is no pneumothorax. Atherosclerotic calcification of the aortic arch. Probable cerclage wire over the left hemidiaphragm. No acute osseous pathology. IMPRESSION: 1. No mass identified in the right upper lobe as clinically questioned. 2. Left lung base atelectasis versus infiltrate. Electronically Signed   By: Elgie CollardArash  Radparvar M.D.   On: 04/10/2019 12:22        Scheduled Meds:  sodium chloride   Intravenous Once   aspirin EC  81 mg Oral Daily   carvedilol  6.25 mg Oral BID WC   Chlorhexidine Gluconate Cloth  6 each Topical Daily   enoxaparin (LOVENOX) injection  40 mg Subcutaneous Q24H   feeding supplement (ENSURE ENLIVE)  237 mL Oral TID BM   ferrous sulfate  325 mg Oral BID WC   influenza vaccine adjuvanted  0.5 mL Intramuscular Tomorrow-1000   insulin aspart   0-5 Units Subcutaneous QHS   insulin aspart  0-9 Units Subcutaneous TID WC   lisinopril  5 mg Oral Daily   mouth rinse  15 mL Mouth Rinse BID   mirtazapine  30 mg Oral QHS   mometasone-formoterol  2 puff Inhalation BID   multivitamin with minerals  1 tablet Oral Daily   pantoprazole  40 mg Oral Daily   pneumococcal 23 valent vaccine  0.5 mL Intramuscular Tomorrow-1000  sodium chloride flush  10-40 mL Intracatheter Q12H   sodium chloride flush  3 mL Intravenous Q12H   ticagrelor  90 mg Oral BID   vitamin B-12  1,000 mcg Oral Daily   Continuous Infusions:  sodium chloride Stopped (04/10/19 1146)   azithromycin Stopped (04/11/19 1138)   cefTRIAXone (ROCEPHIN)  IV Stopped (04/11/19 1025)     LOS: 2 days    Time spent: 35 minutes.   Dana Allan, MD  [facility] Pager #: [phone] 7PM-7AM contact night coverage as above

## 2019-04-12 NOTE — Progress Notes (Signed)
PHARMACIST - PHYSICIAN COMMUNICATION  CONCERNING: Antibiotic IV to Oral Route Change Policy  RECOMMENDATION: This patient is receiving azithromycin by the intravenous route.  Based on criteria approved by the Pharmacy and Therapeutics Committee, the antibiotic(s) is/are being converted to the equivalent oral dose form(s).   DESCRIPTION: These criteria include:  Patient being treated for a respiratory tract infection, urinary tract infection, cellulitis or clostridium difficile associated diarrhea if on metronidazole  The patient is not neutropenic and does not exhibit a GI malabsorption state  The patient is eating (either orally or via tube) and/or has been taking other orally administered medications for a least 24 hours  The patient is improving clinically and has a Tmax < 100.5  If you have questions about this conversion, please contact the Pharmacy Department  []  ( [phone] )  [place] []  ( [phone]  [facility] []  ( [phone]  [place] []  ( [phone]  [facility] [x]  ( [phone] )  [facility]  

## 2019-04-12 NOTE — Progress Notes (Signed)
PT Cancellation Note  Patient Details Name: Eduardo Osborne MRN: [id number] DOB: Jan 13, 1946   Cancelled Treatment:    Reason Eval/Treat Not Completed: Patient declined, no reason specified Pt adamantly refused.  He states, "they have been in here all morning poking and prodding at me."  Pt wishing to rest at this time.   Yianna Tersigni,KATHrine E 04/12/2019, 11:39 AM Carmelia Bake, PT, DPT Acute Rehabilitation Services Office: [phone] Pager: [phone]

## 2019-04-12 NOTE — Progress Notes (Signed)
OT Cancellation Note  Patient Details Name: Eduardo Osborne MRN: [id number] DOB: [birth year]   Cancelled Treatment:    Reason Eval/Treat Not Completed: Other (comment). Pt sleeping soundly in bed. Eyes closed with minimal verbal response. Noted pt's adamant refusal for OOB with PT earlier. Pt also with recent transfer up to 4th floor. Plan to reattempt tomorrow.   Tyrone Schimke, OT Acute Rehabilitation Services Pager: [phone] Office: [phone]  04/12/2019, 3:56 PM

## 2019-04-13 LAB — RENAL FUNCTION PANEL
Albumin: 2.3 g/dL — ABNORMAL LOW (ref 3.5–5.0)
Anion gap: 8 (ref 5–15)
BUN: 20 mg/dL (ref 8–23)
CO2: 27 mmol/L (ref 22–32)
Calcium: 8 mg/dL — ABNORMAL LOW (ref 8.9–10.3)
Chloride: 105 mmol/L (ref 98–111)
Creatinine, Ser: 1.21 mg/dL (ref 0.61–1.24)
GFR calc Af Amer: >60 mL/min (ref >60)
GFR calc non Af Amer: 58 mL/min — ABNORMAL LOW (ref >60)
Glucose, Bld: 109 mg/dL — ABNORMAL HIGH (ref 70–99)
Phosphorus: 3.1 mg/dL (ref 2.5–4.6)
Potassium: 4 mmol/L (ref 3.5–5.1)
Sodium: 140 mmol/L (ref 135–145)

## 2019-04-13 LAB — GLUCOSE, CAPILLARY
Glucose-Capillary: 85 mg/dL (ref 70–99)
Glucose-Capillary: 106 mg/dL — ABNORMAL HIGH (ref 70–99)
Glucose-Capillary: 137 mg/dL — ABNORMAL HIGH (ref 70–99)
Glucose-Capillary: 150 mg/dL — ABNORMAL HIGH (ref 70–99)

## 2019-04-13 LAB — CBC WITH DIFFERENTIAL/PLATELET
Abs Immature Granulocytes: 0.24 10*3/uL — ABNORMAL HIGH (ref 0.00–0.07)
Basophils Absolute: 0.1 10*3/uL (ref 0.0–0.1)
Basophils Relative: 0 %
Eosinophils Absolute: 1.6 10*3/uL — ABNORMAL HIGH (ref 0.0–0.5)
Eosinophils Relative: 11 %
HCT: 27.3 % — ABNORMAL LOW (ref 39.0–52.0)
Hemoglobin: 8.3 g/dL — ABNORMAL LOW (ref 13.0–17.0)
Immature Granulocytes: 2 %
Lymphocytes Relative: 8 %
Lymphs Abs: 1.2 10*3/uL (ref 0.7–4.0)
MCH: 27.7 pg (ref 26.0–34.0)
MCHC: 30.4 g/dL (ref 30.0–36.0)
MCV: 91 fL (ref 80.0–100.0)
Monocytes Absolute: 0.8 10*3/uL (ref 0.1–1.0)
Monocytes Relative: 6 %
Neutro Abs: 10.2 10*3/uL — ABNORMAL HIGH (ref 1.7–7.7)
Neutrophils Relative %: 73 %
Platelets: 610 10*3/uL — ABNORMAL HIGH (ref 150–400)
RBC: 3 MIL/uL — ABNORMAL LOW (ref 4.22–5.81)
RDW: 15.4 % (ref 11.5–15.5)
WBC: 14.1 10*3/uL — ABNORMAL HIGH (ref 4.0–10.5)
nRBC: 0 % (ref 0.0–0.2)

## 2019-04-13 MED ORDER — ASPIRIN EC 81 MG PO TBEC
81.0000 mg | DELAYED_RELEASE_TABLET | Freq: Every day | ORAL | Status: DC
  Administered 2019-04-14 – 2019-04-15 (×2): 81 mg via ORAL
  Filled 2019-04-13 (×2): qty 1

## 2019-04-13 MED ORDER — TICAGRELOR 90 MG PO TABS
90.0000 mg | ORAL_TABLET | Freq: Two times a day (BID) | ORAL | Status: DC
  Administered 2019-04-13 – 2019-04-15 (×4): 90 mg via ORAL
  Filled 2019-04-13 (×5): qty 1

## 2019-04-13 NOTE — Progress Notes (Signed)
NAME:  Eduardo Osborne, MRN:  [id number], DOB:  [birth year], LOS: 3 ADMISSION DATE:  04/10/2019, CONSULTATION DATE:  04/11/2019 REFERRING MD:  Triad Dr Dartha Lodge, CHIEF COMPLAINT:  Breathing trouble   Brief History    73 year old frail cachectic male.  Patient is almost nonexistent historian other than saying that he short of breath.  History is gathered from the review of the chart essentially.  Pulmonary has been consulted for the right upper lobe lung mass greater than 6 cm.  He has a history of coronary artery disease, atrial fibrillation, type 2 diabetes, cachexia, anemia not otherwise specified and COPD not otherwise specified.  It appears that he was admitted to an outside hospital for acute hypoxemic respiratory failure and was placed on BiPAP.  CT chest during this time showed a lung mass and therefore he was transferred here.  Pulmonary was consulted yesterday April 10, 2019.  However the CD-ROM that came with him did not have any images.  Therefore we repeated his his CT scan of the chest.  The main findings are a> 6 cm mass in the right upper lobe anteromedially abutting the mediastinum.  This appears necrotic.  This 1 cm right lower lobe nodule and a less than 1 cm right middle lobe nodule.  In addition there is a left [opposite side] sided effusion with atelectasis.  The effusion is small/moderate in size.  Patient himself is not giving me much of a history.  He is currently on community-acquired pneumonia treatment with IV antibiotics.  He is not on the ventilator.  He is not on pressors.  He is alert.  He seems grumpy when his breakfast is not served on time.  He is on dual antiplatelet therapy with Brilinta and aspirin.  He is also on DVT prophylaxis.  He appears to be on standard COPD treatment.  Past Medical History     has a past medical history of AAA (abdominal aortic aneurysm) (HCC) (04/06/2019), CAD (coronary artery disease), COPD (chronic obstructive pulmonary disease) (HCC),  Diabetes (HCC), Hypertension, and Lung mass (04/06/2019).   reports that he has been smoking cigarettes. He has never used smokeless tobacco.  Past Surgical History:  Procedure Laterality Date   APPENDECTOMY     CHOLECYSTECTOMY     CORONARY ANGIOPLASTY WITH STENT PLACEMENT      Allergies  Allergen Reactions   Aspirin Other (See Comments)    Questionable.. unk.. on paperwork   Penicillins Other (See Comments)    Unk.. on paperwork    Immunization History  Administered Date(s) Administered   Fluad Quad(high Dose 65+) 04/12/2019   Pneumococcal Polysaccharide-23 04/12/2019    Family History  Problem Relation Age of Onset   Heart attack Father      Current Facility-Administered Medications:    0.9 %  sodium chloride infusion (Manually program via Guardrails IV Fluids), , Intravenous, Once, Berton Mount I, MD   0.9 %  sodium chloride infusion, 250 mL, Intravenous, PRN, Pearson Grippe, MD, Stopped at 04/10/19 1146   acetaminophen (TYLENOL) tablet 650 mg, 650 mg, Oral, Q6H PRN **OR** acetaminophen (TYLENOL) suppository 650 mg, 650 mg, Rectal, Q6H PRN, Pearson Grippe, MD   albuterol (PROVENTIL) (2.5 MG/3ML) 0.083% nebulizer solution 3 mL, 3 mL, Inhalation, Q6H PRN, Pearson Grippe, MD   azithromycin [facility]) tablet 500 mg, 500 mg, Oral, Daily, Bell, Michelle T, RPH, 500 mg at 04/13/19 1046   carvedilol (COREG) tablet 6.25 mg, 6.25 mg, Oral, BID WC, Pearson Grippe, MD, 6.25 mg at 04/13/19 1046  cefTRIAXone (ROCEPHIN) 1 g in sodium chloride 0.9 % 100 mL IVPB, 1 g, Intravenous, Q24H, Ogbata, Sylvester I, MD, Last Rate: 200 mL/hr at 04/13/19 1051, 1 g at 04/13/19 1051   Chlorhexidine Gluconate Cloth 2 % PADS 6 each, 6 each, Topical, Daily, Clydia Llanohandra, Aravind, MD, 6 each at 04/13/19 1049   enoxaparin (LOVENOX) injection 40 mg, 40 mg, Subcutaneous, Q24H, Pearson GrippeKim, James, MD, 40 mg at 04/13/19 1047   feeding supplement (ENSURE ENLIVE) (ENSURE ENLIVE) liquid 237 mL, 237 mL, Oral, TID BM,  Berton Mountgbata, Sylvester I, MD, 237 mL at 04/13/19 1047   ferrous sulfate tablet 325 mg, 325 mg, Oral, BID WC, Pearson GrippeKim, James, MD, 325 mg at 04/13/19 1046   insulin aspart (novoLOG) injection 0-5 Units, 0-5 Units, Subcutaneous, QHS, Pearson GrippeKim, James, MD   insulin aspart (novoLOG) injection 0-9 Units, 0-9 Units, Subcutaneous, TID WC, Pearson GrippeKim, James, MD, 3 Units at 04/11/19 1640   lisinopril (ZESTRIL) tablet 5 mg, 5 mg, Oral, Daily, Pearson GrippeKim, James, MD, 5 mg at 04/13/19 1046   MEDLINE mouth rinse, 15 mL, Mouth Rinse, BID, Pearson GrippeKim, James, MD, 15 mL at 04/13/19 1048   mirtazapine (REMERON) tablet 30 mg, 30 mg, Oral, QHS, Pearson GrippeKim, James, MD, 30 mg at 04/12/19 2151   mometasone-formoterol (DULERA) 200-5 MCG/ACT inhaler 2 puff, 2 puff, Inhalation, BID, Pearson GrippeKim, James, MD, 2 puff at 04/13/19 0841   multivitamin with minerals tablet 1 tablet, 1 tablet, Oral, Daily, Berton Mountgbata, Sylvester I, MD, 1 tablet at 04/13/19 1046   ondansetron (ZOFRAN) injection 4 mg, 4 mg, Intravenous, Q6H PRN, Pearson GrippeKim, James, MD   pantoprazole (PROTONIX) EC tablet 40 mg, 40 mg, Oral, Daily, Pearson GrippeKim, James, MD, 40 mg at 04/13/19 1047   sodium chloride flush (NS) 0.9 % injection 10-40 mL, 10-40 mL, Intracatheter, Q12H, Berton Mountgbata, Sylvester I, MD, 15 mL at 04/12/19 2155   sodium chloride flush (NS) 0.9 % injection 10-40 mL, 10-40 mL, Intracatheter, PRN, Berton Mountgbata, Sylvester I, MD   sodium chloride flush (NS) 0.9 % injection 3 mL, 3 mL, Intravenous, Q12H, Pearson GrippeKim, James, MD, 3 mL at 04/13/19 1051   sodium chloride flush (NS) 0.9 % injection 3 mL, 3 mL, Intravenous, PRN, Pearson GrippeKim, James, MD   vitamin B-12 (CYANOCOBALAMIN) tablet 1,000 mcg, 1,000 mcg, Oral, Daily, Berton Mountgbata, Sylvester I, MD, 1,000 mcg at 04/13/19 1046    Significant Hospital Events   04/10/2019 - admit 04/11/2019 - consult  Consults:  11/6 - pulm consult  Procedures:  11/6 - planned thora - LDH 105 , TP < 3.0, Cytology - pending  Significant Diagnostic Tests:  x  Micro Data:  11/5 - MRSA PCR - neg 11/6 -  pleural fluid - neg as of 11/8      Antimicrobials:  11/5 ceftriaxone 11/5 azithro >>     Interim history/subjective:   04/13/2019 - patient now in floor. On 4LNC. Says he is on 3L  at home. Thora left side - borderline exudate based on LDH/ transudate base on protein. Cytology pending from 04/11/2019. Dr Delton CoombesByrum indicated he could do ENB/EBUS 04/15/2019 Tuesday and Dr Tonia BroomsIcard later in the week. Brilinta and aspirin stopped in anticipation. Triad MD thinks best patinet stay and do it while in house because lives alone, far away and poor social stiuation  However patient refused blood gas.  Critical care MD went to talk to the patient.  Patient was categorical that he lives alone and does not rate his home rent.  Therefore he needs to absolutely get home.  He says he will oblige and do  the procedure as needed in a few weeks but not at this present time.  He says he is unable to wait even 48 hours in the hospital to have the procedure done.  In addition he lives alone.  His only social contact is his son who lives in [place].  His son does not know all the details of his illness.  Patient is somewhat wary about the risks of bronchoscopy  At baseline his body mass index appears to be 15 and is on 4 L of oxygen with pursed lip breathing.  Objective   Blood pressure 116/64, pulse 76, temperature 98 F (36.7 C), resp. rate (!) 24, height  (1.803 m), weight 50.4 kg, SpO2 95 %.        Intake/Output Summary (Last 24 hours) at 04/13/2019 1526 Last data filed at 04/13/2019 1455 Gross per 24 hour  Intake 358 ml  Output 450 ml  Net -92 ml   Filed Weights   04/10/19 0536 04/11/19 0500 04/13/19 0500  Weight: 50.3 kg 50.3 kg 50.4 kg    Examination: General: Extremely frail gentleman with lean body mass index and cachexia.  Looks very deconditioned. HENT: no neck nodes, no elevated JVP Lungs: L pursed lip breathing present on 4 L oxygen.  Air entry equal in the upper lobes.  Lower lobes  not examined.  No wheeze Cardiovascular: Normal heart sounds  abdomen: Soft nontender  extremities: Intact without any edema clubbing or cyanosis Neuro alert and oriented.  Seems to have capacity plus  GU: not examined   No results found.   Resolved Hospital Problem list   x  Assessment & Plan:  Right lung mass - nectrotic 6.3cm:  RML and RLL nodule and mediasinal nodes  -Per both Dr. Elige Radon Icard and Dr. Levy Pupa the lesions are accessible through combination of electro navigational bronchoscopy and endobronchial ultrasound guided bronchoscopy.  -However given his cachexia and advanced COPD with 4 L oxygen sedation anesthesia related procedures could be of risk.  He is refusing blood gas as part of the risk evaluation.  In addition he is reluctant to have the procedure this week because of social issues   Plan -Resume his dual antiplatelet therapy -Arrange for outpatient PET scan in the next 1-2 weeks -After the PET scan, PET scan,  arrange for outpatient follow-up with either Dr. Levy Pupa or Dr. Audie Box in the pulmonary clinic in the next 2 weeks (an appointment can also be made with a nurse practitioner such as Kandice Robinsons who specializes in lung cancer]  Left pleural effusion  -   This appears borderline transudate/exudate on thoracentesis April 11, 2019 Plan -Order echocardiogram  --Await cytology  Emphysema  - per riatd -Ideally will need spirometry and DLCO done prior to outpatient follow-up   .> 50% of this > 40 min visit spent in face to face counseling or/and coordination of care - by this undersigned MD - Dr Kalman Shan. This includes one or more of the following documented above: discussion of test results, diagnostic or treatment recommendations, prognosis, risks and benefits of management options, instructions, education, compliance or risk-factor reduction   Case discussed with Dr. Levy Pupa today April 13, 2019  Best practice:    Per triad    LABS    PULMONARY No results for input(s): PHART, PCO2ART, PO2ART, HCO3, TCO2, O2SAT in the last 168 hours.  Invalid input(s): PCO2, PO2  CBC Recent Labs  Lab 04/10/19 1015 04/10/19 2030 04/11/19 0500 04/12/19 0500  HGB 6.6* 8.3* 8.9* 8.1*  HCT 21.3*   19.5* 26.1* 28.5* 26.4*  WBC 17.1*  --  27.9* 16.3*  PLT 394  --  492* 512*    COAGULATION No results for input(s): INR in the last 168 hours.  CARDIAC  No results for input(s): TROPONINI in the last 168 hours. No results for input(s): PROBNP in the last 168 hours.   CHEMISTRY Recent Labs  Lab 04/10/19 0630 04/11/19 0500 04/11/19 1807 04/12/19 0500 04/13/19 1158  NA 135 138 141 142 140  K 3.8 2.8* 3.5 4.0 4.0  CL 98 96* 102 103 105  CO2 27 30 30 29 27   GLUCOSE 89 133* 104* 99 109*  BUN 19 24* 29* 25* 20  CREATININE 1.08 1.22 1.37* 1.34* 1.21  CALCIUM 7.6* 8.2* 7.7* 8.2* 8.0*  MG  --  1.8  --  2.3  --   PHOS  --   --   --  2.9 3.1   Estimated Creatinine Clearance: 37.6 mL/min (by C-G formula based on SCr of 1.21 mg/dL).   LIVER Recent Labs  Lab 04/10/19 0630 04/11/19 0500 04/11/19 1250 04/12/19 0500 04/13/19 1158  AST 13* 11*  --   --   --   ALT 12 13  --   --   --   ALKPHOS 57 66  --   --   --   BILITOT 0.5 1.3*  --   --   --   PROT 5.7* 6.5 6.0*  --   --   ALBUMIN 2.3* 2.6*  --  2.3* 2.3*     INFECTIOUS Recent Labs  Lab 04/10/19 1015 04/12/19 0321  PROCALCITON 0.91 0.41     ENDOCRINE CBG (last 3)  Recent Labs    04/12/19 2118 04/13/19 0823 04/13/19 1144  GLUCAP 127* 85 106*         IMAGING x48h  - image(s) personally visualized  -   highlighted in bold No results found.

## 2019-04-13 NOTE — Progress Notes (Signed)
PROGRESS NOTE    Eduardo Osborne  AYT:[id number] DOB: 10-Feb-1946 DOA: 04/10/2019 PCP: Georgeanne Nim, MD  Outpatient Specialists:   Brief Narrative:  Patient is a 73 year old Caucasian male, cachectic, disheveled, possibly failing to thrive, with past medical history significant for coronary artery disease, atrial fibrillation, diabetes mellitus type 2, and anemia, COPD on home oxygen, 60 pack years and history of 8 mm of lung mass also 07/22/2018.  Patient was admitted to [facility] with greater than 6.9 cm right upper lobe lung mass, possible pneumonia/sepsis.  Patient was transferred to [facility] health system for further management of lung mass.  Pulmonary team has been consulted to assist with patient's management.  Significant leukocytosis is noted.  CT chest with contrast done in this hospital on 04/10/2019 revealed indeterminate 2.9 x 6.3 cm anterior medial right upper lobe opacity with mildly enlarged right mediastinal lymph node, said to be suspicious for malignancy but infection could not be ruled out.  1.1 cm right middle lobe groundglass nodule and 1.1 cm right lower lobe irregular opacity was also recommended.  Moderate left-sided effusion was reported with moderate left lower lobe atelectasis.  Findings suggestive of coronary artery disease and aortic atherosclerosis were noted.  Pulmonary team is assisting with patient's care.  Thoracentesis is planned today and pleural fluid will be sent for analysis.  Patient has had intermittent episodes of hypotension that have responded to volume resuscitation.  Further management will depend on hospital course.  04/12/2019: Patient seen.  Patient is more stable today.  Pleural fluid analysis noted, likely exudative based on light criteria.  Follow final pleural fluid results.  Discussed with pulmonary team, input is highly appreciated (for possible EBUS).  01/23/2019: Patient seen.  Also discussed with the pulmonary team.   Awaiting EBUS.  Patient is improving slowly.  Will start calorie count.  Further management will depend on hospital course.  Assessment & Plan:   Principal Problem:   Lung mass Active Problems:   COPD (chronic obstructive pulmonary disease) (HCC)   Protein-calorie malnutrition, severe   S/P thoracentesis   Pleural effusion  Right upper lobe lung mass:  -Repeat CT chest is as documented above.   -Pulmonary input is highly appreciated, for possible EBUS.   -Patient is status post thoracentesis, pleural fluid analysis reviewed.   -Patient has more than 60 pack years. -Patient has been pancultured as well.   -Continue IV antibiotics for now. -Follow final culture results.     04/13/2019: Discussed with the pulmonary team.  For possible EBUS.  Possible pneumonia/SIRS/sepsis: -Follow cultures.   -Patient is currently on IV ceftriaxone and azithromycin.   -Procalcitonin checked earlier was 0.91.   -Intermittent episodes of hypotension has been documented.   -Further management will depend on hospital course.   -Repeat procalcitonin done earlier today is improving, 0.41.   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  Compression fracture  T12, new mild naterior compression fracture T11 Check vitamin D 25-oh -TSH is 0.625.,  CAD s/p DES Cont Aspirin. Cont Brillinta. Cont Carvedilol (If blood pressure allows). Cont  Lisinopril (if blood pressure allows). 04/13/2019: Blood pressure control is improving.  Gerd Cont PPI  Anemia: -Iron level is 8 -Ferritin is 73 -TIBC is 243 -MCV is normal. -Cannot rule out anemia of chronic inflammation versus anemia secondary to combined iron deficiency and chronic inflammation. -RBC folate level is 1328 -Vitamin B12 is 276. -Fecal occult blood is pending. -Patient has been transfused with  1 unit of packed red blood cells. -Hemoglobin today is 8.9 g/dL (hemoglobin was 6.6 g/dL on  32/91/9166)  Severe malnutrition/weight loss/failure to thrive: Prealbumin is 8.9. Albumin is 2.6. Dietary consult. Patient has good appetite. BMI is 15.47 kg/m. 04/13/2019: Color calm.  DVT prophylaxis: Subcutaneous Lovenox Code Status: Full code Family Communication:  Disposition Plan: This will depend on hospital course   Consultants:   Pulmonary/critical care team  Procedures:   Thoracentesis is planned today  Antimicrobials:   IV ceftriaxone  IV azithromycin   Subjective: Patient is a poor historian No fever or chills. No chest pain or shortness of breath No significant cough or phlegm production.  Objective: Vitals:   04/12/19 2116 04/13/19 0500 04/13/19 0636 04/13/19 0841  BP: (!) 112/57  121/85   Pulse: 62  71 73  Resp: 16  18 18   Temp: 98.7 F (37.1 C)  98.1 F (36.7 C)   TempSrc: Oral     SpO2: 97%  97% 95%  Weight:  50.4 kg    Height:        Intake/Output Summary (Last 24 hours) at 04/13/2019 1103 Last data filed at 04/13/2019 1051 Gross per 24 hour  Intake 368 ml  Output 700 ml  Net -332 ml   Filed Weights   04/10/19 0536 04/11/19 0500 04/13/19 0500  Weight: 50.3 kg 50.3 kg 50.4 kg    Examination:  General exam: Appears cachectic and disheveled. Respiratory system: Decreased air entry. Cardiovascular system: S1 & S2 heard Gastrointestinal system: Abdomen is nondistended, soft and nontender. No organomegaly or masses felt. Normal bowel sounds heard. Central nervous system: Alert and oriented.  Patient moves all extremities.   Extremities: No leg edema.  Data Reviewed: I have personally reviewed following labs and imaging studies  CBC: Recent Labs  Lab 04/10/19 0630 04/10/19 1015 04/10/19 2030 04/11/19 0500 04/12/19 0500  WBC 16.4* 17.1*  --  27.9* 16.3*  NEUTROABS  --   --   --  22.5* 11.5*  HGB 6.5* 6.6* 8.3* 8.9* 8.1*  HCT 20.4* 21.3*   19.5* 26.1* 28.5* 26.4*  MCV 87.2 87.3  --  86.9 90.1  PLT 383 394  --  492* 512*    Basic Metabolic Panel: Recent Labs  Lab 04/10/19 0630 04/11/19 0500 04/11/19 1807 04/12/19 0500  NA 135 138 141 142  K 3.8 2.8* 3.5 4.0  CL 98 96* 102 103  CO2 27 30 30 29   GLUCOSE 89 133* 104* 99  BUN 19 24* 29* 25*  CREATININE 1.08 1.22 1.37* 1.34*  CALCIUM 7.6* 8.2* 7.7* 8.2*  MG  --  1.8  --  2.3  PHOS  --   --   --  2.9   GFR: Estimated Creatinine Clearance: 34 mL/min (A) (by C-G formula based on SCr of 1.34 mg/dL (H)). Liver Function Tests: Recent Labs  Lab 04/10/19 0630 04/11/19 0500 04/11/19 1250 04/12/19 0500  AST 13* 11*  --   --   ALT 12 13  --   --   ALKPHOS 57 66  --   --   BILITOT 0.5 1.3*  --   --   PROT 5.7* 6.5 6.0*  --   ALBUMIN 2.3* 2.6*  --  2.3*   No results for input(s): LIPASE, AMYLASE in the last 168 hours. No results for input(s): AMMONIA in the last 168 hours. Coagulation Profile: No results for input(s): INR, PROTIME in the last 168 hours. Cardiac Enzymes: No results for input(s): CKTOTAL, CKMB, CKMBINDEX,  TROPONINI in the last 168 hours. BNP (last 3 results) No results for input(s): PROBNP in the last 8760 hours. HbA1C: No results for input(s): HGBA1C in the last 72 hours. CBG: Recent Labs  Lab 04/12/19 0743 04/12/19 1210 04/12/19 1635 04/12/19 2118 04/13/19 0823  GLUCAP 92 133* 119* 127* 85   Lipid Profile: Recent Labs    04/11/19 1250  CHOL 100   Thyroid Function Tests: No results for input(s): TSH, T4TOTAL, FREET4, T3FREE, THYROIDAB in the last 72 hours. Anemia Panel: No results for input(s): VITAMINB12, FOLATE, FERRITIN, TIBC, IRON, RETICCTPCT in the last 72 hours. Urine analysis: No results found for: COLORURINE, APPEARANCEUR, LABSPEC, PHURINE, GLUCOSEU, HGBUR, BILIRUBINUR, KETONESUR, PROTEINUR, UROBILINOGEN, NITRITE, LEUKOCYTESUR Sepsis Labs: (procalcitonin:4,lacticidven:4)  ) Recent Results (from the past 240 hour(s))  MRSA PCR Screening     Status: None   Collection Time: 04/10/19  4:36 AM    Specimen: Nasal Mucosa; Nasopharyngeal  Result Value Ref Range Status   MRSA by PCR NEGATIVE NEGATIVE Final    Comment:        The GeneXpert MRSA Assay (FDA approved for NASAL specimens only), is one component of a comprehensive MRSA colonization surveillance program. It is not intended to diagnose MRSA infection nor to guide or monitor treatment for MRSA infections. Performed at [facility], [address] [place], [place] [address]   Body fluid culture     Status: None (Preliminary result)   Collection Time: 04/11/19 11:31 AM   Specimen: Thoracentesis; Body Fluid  Result Value Ref Range Status   Specimen Description   Final    THORACENTESIS Performed at [facility], [address], [place], [place] [address]    Special Requests   Final    Normal Performed at [facility], [address], [place], [place] [address]    Gram Stain   Final    MODERATE WBC PRESENT, PREDOMINANTLY PMN NO ORGANISMS SEEN    Culture   Final    NO GROWTH 2 DAYS Performed at [facility], [address] [place], [place] [address]    Report Status PENDING  Incomplete         Radiology Studies: Dg Chest Port 1 View  Result Date: 04/11/2019 CLINICAL DATA:  Post thoracentesis. EXAM: PORTABLE CHEST 1 VIEW COMPARISON:  Radiographs and CT 04/10/2019. FINDINGS: 1210 hours. The left pleural effusion and aeration of the left lung base appear improved following presumed left-sided thoracentesis. There is no pneumothorax. Known opacity medially in the right upper lobe is not well visualized on this study. The right lung is otherwise clear. The heart size and mediastinal contours are stable with diffuse aortic atherosclerosis. Right IJ central venous catheter extends to the level of the superior cavoatrial junction. IMPRESSION: 1. No evidence of pneumothorax following thoracentesis. Residual pleural effusion and aeration of the left lung base  appear improved. 2. Known right upper lobe mass/infiltrate is not well visualized. Electronically Signed   By: Carey Bullocks M.D.   On: 04/11/2019 12:57        Scheduled Meds:  sodium chloride   Intravenous Once   aspirin EC  81 mg Oral Daily   azithromycin  500 mg Oral Daily   carvedilol  6.25 mg Oral BID WC   Chlorhexidine Gluconate Cloth  6 each Topical Daily   enoxaparin (LOVENOX) injection  40 mg Subcutaneous Q24H   feeding supplement (ENSURE ENLIVE)  237 mL Oral TID BM   ferrous sulfate  325 mg  Oral BID WC   insulin aspart  0-5 Units Subcutaneous QHS   insulin aspart  0-9 Units Subcutaneous TID WC   lisinopril  5 mg Oral Daily   mouth rinse  15 mL Mouth Rinse BID   mirtazapine  30 mg Oral QHS   mometasone-formoterol  2 puff Inhalation BID   multivitamin with minerals  1 tablet Oral Daily   pantoprazole  40 mg Oral Daily   sodium chloride flush  10-40 mL Intracatheter Q12H   sodium chloride flush  3 mL Intravenous Q12H   ticagrelor  90 mg Oral BID   vitamin B-12  1,000 mcg Oral Daily   Continuous Infusions:  sodium chloride Stopped (04/10/19 1146)   cefTRIAXone (ROCEPHIN)  IV 1 g (04/13/19 1051)     LOS: 3 days    Time spent: 35 minutes.   Berton MountSylvester Lynel Forester, MD  [facility] Pager #: [phone] 7PM-7AM contact night coverage as above

## 2019-04-13 NOTE — Progress Notes (Signed)
Nutrition Note  RD consulted for nutritional assessment. Initial assessment completed 11/5. Pt was diagnosed with severe malnutrition and Ensure supplements were ordered.   Per chart review, pt is still having disappointment with meals but still has good appetite. Pt is drinking Ensure supplements.  No further nutrition interventions warranted at this time. Will continue to follow for needs.  Clayton Bibles, MS, RD, LDN Inpatient Clinical Dietitian Pager: [phone] After Hours Pager: [phone]

## 2019-04-13 NOTE — Progress Notes (Signed)
PCCM Brief Note  I reviewed the patient's CT chest with Dr. Chase Caller.  Believes that his right upper lobe and left lower lobe abnormalities should be accessible via bronchoscopy with electromagnetic navigation.   Tentatively set up for navigational bronchoscopy at [facility] at 9:00 on 11/10.  Patient will need to be off of Brilinta, enoxaparin, aspirin in preparation for this.  I will confirm with pharmacy the appropriate timing for the patient to be off Brilinta before biopsy.  If 48 hours is not long enough then I will reschedule the procedure.  I stopped the Brilinta today.  Baltazar Apo, MD, PhD 04/13/2019, 1:29 PM [facility] [phone] or if no answer [phone]

## 2019-04-13 NOTE — Progress Notes (Signed)
Pt refused ABG.  Dr Chase Caller is aware.

## 2019-04-13 NOTE — Evaluation (Signed)
Occupational Therapy Evaluation Patient Details Name: Eduardo Osborne MRN: [id number] DOB: 1946/05/10 Today's Date: 04/13/2019    History of Present Illness Patient is a 73 year old Caucasian male, cachectic, disheveled, possibly failing to thrive, with PMH significant for CAD, AFib, DM2, anemia, COPD on home oxygen, 60 pack year smoking, and history of 8 mm of lung mass 07/22/2018.  admitted for management of lung mass.  CT findings suggestive of CAD and aortic atherosclerosis were noted.  s/p Thoracentesis. Patient has had intermittent episodes of hypotension that have responded to volume resuscitation.   Clinical Impression   Pt was living at home and mod I (using SPC to get around because RW is too bulky for house), doing own bathing/cooking. Question true ability to perform based on appearance. From chart review and per Pt he does not have any family support available to help him. He does not seen to understand his current medical problems - perseverating on getting home by Tuesday to pay rent. He was mod A to come EOB, max A for LB dressing, he was able to wash his face sitting EOB - deferred OOB activity until +2 assistance is present for safety. Mod A to return supine, and Pt able to assist with sliding back up the bed. Pt DOE 3/4 with activity. OT will follow acutely and recommend SNF at this time- although PT likely to decline.    Follow Up Recommendations  SNF;Supervision/Assistance - 24 hour    Equipment Recommendations  None recommended by OT(Pt has appropriate DME at home)    Recommendations for Other Services       Precautions / Restrictions Precautions Precautions: Fall Precaution Comments: supplemental O2, watch toenails Restrictions Weight Bearing Restrictions: No      Mobility Bed Mobility Overal bed mobility: Needs Assistance Bed Mobility: Supine to Sit;Sit to Supine     Supine to sit: Mod assist Sit to supine: Mod assist   General bed mobility comments: assist  to initiate movement, trunk elevation, and BLE back into bed, Pt able to assist with sliding up the bed  Transfers Overall transfer level: (NT this session)               General transfer comment: needed +2 for safety/physical assist and second person not present    Balance                                           ADL either performed or assessed with clinical judgement   ADL Overall ADL's : Needs assistance/impaired Eating/Feeding: Set up   Grooming: Wash/dry hands;Wash/dry face;Set up;Sitting   Upper Body Bathing: Minimal assistance;Sitting   Lower Body Bathing: Moderate assistance;Sitting/lateral leans Lower Body Bathing Details (indicate cue type and reason): assist for knees down Upper Body Dressing : Moderate assistance   Lower Body Dressing: Maximal assistance;Sitting/lateral leans Lower Body Dressing Details (indicate cue type and reason): could not put on own socks - needs assist to get around toe nails Toilet Transfer: +2 for safety/equipment;Moderate assistance;Stand-pivot;BSC   Toileting- Clothing Manipulation and Hygiene: Moderate assistance Toileting - Clothing Manipulation Details (indicate cue type and reason): currently with condom cath     Functional mobility during ADLs: +2 for safety/equipment General ADL Comments: Pt self-limiting, requires constant redirection to task, cannot tell if it is avoidance behaviors or true attention     Vision Baseline Vision/History: Wears glasses Wears Glasses: Reading only Patient Visual Report:  No change from baseline       Perception     Praxis      Pertinent Vitals/Pain Pain Assessment: No/denies pain     Hand Dominance Right   Extremity/Trunk Assessment Upper Extremity Assessment Upper Extremity Assessment: Generalized weakness   Lower Extremity Assessment Lower Extremity Assessment: Generalized weakness(cramping)   Cervical / Trunk Assessment Cervical / Trunk Assessment:  Kyphotic   Communication Communication Communication: No difficulties   Cognition Arousal/Alertness: Awake/alert Behavior During Therapy: Anxious Overall Cognitive Status: No family/caregiver present to determine baseline cognitive functioning Area of Impairment: Attention;Following commands;Safety/judgement;Awareness;Problem solving                   Current Attention Level: Sustained   Following Commands: Follows one step commands with increased time(unsure if behavioral or cognitive) Safety/Judgement: Decreased awareness of safety;Decreased awareness of deficits Awareness: Emergent Problem Solving: Decreased initiation;Requires verbal cues     General Comments       Exercises     Shoulder Instructions      Home Living Family/patient expects to be discharged to:: Private residence Living Arrangements: Alone   Type of Home: House Home Access: Stairs to enter [employer] of Steps: 3 Entrance Stairs-Rails: Right;Left;Can reach both Home Layout: One level     Bathroom Shower/Tub: Corporate investment banker: Standard     Home Equipment: Clinical cytogeneticist - 4 wheels;Cane - single point;Hospital bed   Additional Comments: does own cooking (typically microwave)      Prior Functioning/Environment Level of Independence: Independent with assistive device(s)        Comments: used SPC and shower chair on 3L home O2        OT Problem List: Decreased activity tolerance;Decreased strength;Impaired balance (sitting and/or standing);Decreased safety awareness;Decreased knowledge of use of DME or AE;Decreased knowledge of precautions;Pain      OT Treatment/Interventions: Self-care/ADL training;Therapeutic exercise;DME and/or AE instruction;Therapeutic activities;Patient/family education;Balance training    OT Goals(Current goals can be found in the care plan section) Acute Rehab OT Goals Patient Stated Goal: get home by tuesday to pay  rent OT Goal Formulation: With patient Time For Goal Achievement: 04/27/19 Potential to Achieve Goals: Good ADL Goals Pt Will Perform Grooming: with modified independence;sitting Pt Will Perform Upper Body Dressing: Independently;sitting Pt Will Perform Lower Body Dressing: sit to/from stand;with supervision Pt Will Transfer to Toilet: with supervision;ambulating Pt Will Perform Toileting - Clothing Manipulation and hygiene: with modified independence;sitting/lateral leans Additional ADL Goal #1: Pt will verbalize 3 energy conservation strategies to use during ADL with 1 cue or less  OT Frequency: Min 2X/week   Barriers to D/C: Decreased caregiver support  Pt lives alone and has no family/friends who support him       Co-evaluation              AM-PAC OT "6 Clicks" Daily Activity     Outcome Measure Help from another person eating meals?: None Help from another person taking care of personal grooming?: A Little Help from another person toileting, which includes using toliet, bedpan, or urinal?: A Lot Help from another person bathing (including washing, rinsing, drying)?: A Lot Help from another person to put on and taking off regular upper body clothing?: A Little Help from another person to put on and taking off regular lower body clothing?: A Lot 6 Click Score: 16   End of Session Equipment Utilized During Treatment: Oxygen Nurse Communication: Mobility status  Activity Tolerance: Patient limited by pain Patient left: in bed;with  call bell/phone within reach;with bed alarm set  OT Visit Diagnosis: Unsteadiness on feet (R26.81);Other abnormalities of gait and mobility (R26.89);Muscle weakness (generalized) (M62.81);Adult, failure to thrive (R62.7);Other symptoms and signs involving cognitive function                Time: 1400-1440 OT Time Calculation (min): 40 min Charges:  OT General Charges $OT Visit: 1 Visit OT Evaluation $OT Eval Moderate Complexity: 1 Mod OT  Treatments $Self Care/Home Management : 23-37 mins  Sherryl MangesLaura Rayelynn Loyal OTR/L Acute Rehabilitation Services Pager: [phone] Office: [phone]  Evern BioLaura J Vasco Chong 04/13/2019, 5:42 PM

## 2019-04-14 ENCOUNTER — Inpatient Hospital Stay (HOSPITAL_COMMUNITY): Payer: Medicare Other

## 2019-04-14 DIAGNOSIS — E43 Unspecified severe protein-calorie malnutrition: Secondary | ICD-10-CM

## 2019-04-14 DIAGNOSIS — J432 Centrilobular emphysema: Secondary | ICD-10-CM

## 2019-04-14 DIAGNOSIS — I2510 Atherosclerotic heart disease of native coronary artery without angina pectoris: Secondary | ICD-10-CM

## 2019-04-14 LAB — BODY FLUID CULTURE
Culture: NO GROWTH
Special Requests: NORMAL

## 2019-04-14 LAB — GLUCOSE, CAPILLARY
Glucose-Capillary: 90 mg/dL (ref 70–99)
Glucose-Capillary: 140 mg/dL — ABNORMAL HIGH (ref 70–99)
Glucose-Capillary: 151 mg/dL — ABNORMAL HIGH (ref 70–99)
Glucose-Capillary: 116 mg/dL — ABNORMAL HIGH (ref 70–99)

## 2019-04-14 LAB — ECHOCARDIOGRAM COMPLETE
Height: 71 in
Weight: 1806.01 oz

## 2019-04-14 NOTE — TOC Transition Note (Signed)
Transition of Care [facility]) - CM/SW Discharge Note   Patient Details  Name: Eduardo Osborne MRN: [id number] Date of Birth: [birth year]  Transition of Care [facility] CM/SW Contact:  Eduardo Phi, RN Phone Number: 04/14/2019, 3:43 PM   Clinical Narrative:Spoke to son Eduardo Osborne on phone [phone]-in agreement to what patient prefers if patient wants home-can arrange [facility] Eduardo Osborne able to [employer] office for HHRN/PT/OT-await face to face order. Already on home 02-[facility]. Patient will transport by [facility].       Final next level of care: Home w Home Health Services Barriers to Discharge: Continued Medical Work up   Patient Goals and CMS Choice   CMS Medicare.gov Compare Post Acute Care list provided to:: Patient Represenative (must comment)(Eduardo Osborne son [phone]) Choice offered to / list presented to : Adult Children  Discharge Placement                       Discharge Plan and Services                          HH Arranged: PT, RN, OT   Date [facility] Agency Contacted: 04/14/19 Time [place]: [phone] Representative spoke with at [place]: [place] Determinants of Health ([place]) Interventions     Readmission Risk Interventions No flowsheet data found.

## 2019-04-14 NOTE — Progress Notes (Signed)
Calorie Count Note  48 hour calorie count ordered. Day 1 results below.  Diet: Regular Supplements:  -Ensure Enlive po TID, each supplement provides 350 kcal and 20 grams of protein  11/8: Breakfast: 415 kcals, 9g protein Lunch: 440 kcals, 17g protein Dinner: 0% Supplements: 350 kcals, 20g protein (1 Ensure)  Total intake: 1205 kcal (80% of minimum estimated needs)  46g protein (65% of minimum estimated needs)  Nutrition Dx: Severe Malnutrition related to chronic illness (COPD) as evidenced by severe fat depletion, severe muscle depletion  Goal: Pt to meet >/= 90% of their estimated nutrition needs   Intervention:  -Continue Ensure TID -Continue Calorie Count -day 2  Clayton Bibles, MS, RD, LDN Inpatient Clinical Dietitian Pager: [phone] After Hours Pager: [phone]

## 2019-04-14 NOTE — Progress Notes (Signed)
PROGRESS NOTE    Eduardo Osborne  WNU:[id number] DOB: 1945-08-27 DOA: 04/10/2019 PCP: Georgeanne Nim, MD  Outpatient Specialists:   Brief Narrative:  Patient is a 73 year old Caucasian male, cachectic, disheveled, possibly failing to thrive, with past medical history significant for coronary artery disease, atrial fibrillation, diabetes mellitus type 2, and anemia, COPD on home oxygen, 60 pack years and history of 8 mm of lung mass also 07/22/2018.  Patient was admitted to [facility] with greater than 6.9 cm right upper lobe lung mass, possible pneumonia/sepsis.  Patient was transferred to [facility] health system for further management of lung mass.  Pulmonary team has been consulted to assist with patient's management.  Significant leukocytosis is noted.  CT chest with contrast done in this hospital on 04/10/2019 revealed indeterminate 2.9 x 6.3 cm anterior medial right upper lobe opacity with mildly enlarged right mediastinal lymph node, said to be suspicious for malignancy but infection could not be ruled out.  1.1 cm right middle lobe groundglass nodule and 1.1 cm right lower lobe irregular opacity was also recommended.  Moderate left-sided effusion was reported with moderate left lower lobe atelectasis.  Findings suggestive of coronary artery disease and aortic atherosclerosis were noted.  Pulmonary team is assisting with patient's care.  Thoracentesis is planned today and pleural fluid will be sent for analysis.  Patient has had intermittent episodes of hypotension that have responded to volume resuscitation.  Further management will depend on hospital course.  04/12/2019: Patient seen.  Patient is more stable today.  Pleural fluid analysis noted, likely exudative based on light criteria.  Follow final pleural fluid results.  Discussed with pulmonary team, input is highly appreciated (for possible EBUS).  04/13/2019: Patient seen.  Also discussed with the pulmonary team.   Awaiting EBUS.  Patient is improving slowly.  Will start calorie count.  Further management will depend on hospital course.  04/14/2019: Patient seen alongside patient's nurse.  PT and OT input appreciated, both have recommended skilled nursing facility placement.  Patient is adamant he would not want to go to skilled nursing facility.  Patient insists on being discharged back on tomorrow.  We have no other option but to discharge patient home with home health PT OT tomorrow.  Also discussed with the pulmonary team, patient will follow up with the pulmonary team next week Monday.  Assessment & Plan:   Principal Problem:   Lung mass Active Problems:   COPD (chronic obstructive pulmonary disease) (HCC)   Protein-calorie malnutrition, severe   S/P thoracentesis   Pleural effusion  Right upper lobe lung mass:  -Repeat CT chest is as documented above.   -Pulmonary input is highly appreciated, for possible EBUS.   -Patient is status post thoracentesis, pleural fluid analysis reviewed.   -Patient has more than 60 pack years. -Patient has been pancultured as well.   -Continue IV antibiotics for now. -Follow final culture results.     04/13/2019: Discussed with the pulmonary team.  For possible EBUS. 04/14/2019: Patient will follow with the pulmonary team next week Monday.  Further management will be as per the pulmonary team.  Possible pneumonia/SIRS/sepsis: -Follow cultures.   -Patient is currently on IV ceftriaxone and azithromycin.   -Procalcitonin checked earlier was 0.91.   -Intermittent episodes of hypotension has been documented.   -Further management will depend on hospital course.   -Repeat procalcitonin done earlier today is improving, 0.41. 04/14/2019: Complete course of antibiotics.  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  Compression fracture  T12, new mild naterior compression fracture T11 Check vitamin D 25-oh -TSH is 0.625.,   CAD s/p DES Cont Aspirin. Cont Brillinta. Cont Carvedilol (If blood pressure allows). Cont  Lisinopril (if blood pressure allows). 04/13/2019: Blood pressure control is improving.  Gerd Cont PPI  Anemia: -Iron level is 8 -Ferritin is 73 -TIBC is 243 -MCV is normal. -Cannot rule out anemia of chronic inflammation versus anemia secondary to combined iron deficiency and chronic inflammation. -RBC folate level is 1328 -Vitamin B12 is 276. -Fecal occult blood is pending. -Patient has been transfused with 1 unit of packed red blood cells. -Hemoglobin today is 8.9 g/dL (hemoglobin was 6.6 g/dL on 29/52/841311/10/2018)  Severe malnutrition/weight loss/failure to thrive: Prealbumin is 8.9. Albumin is 2.6. Dietary consult. Patient has good appetite. BMI is 15.47 kg/m. 04/13/2019: Color calm.  DVT prophylaxis: Subcutaneous Lovenox Code Status: Full code Family Communication:  Disposition Plan: This will depend on hospital course   Consultants:   Pulmonary/critical care team  Procedures:   Thoracentesis is planned today  Antimicrobials:   IV ceftriaxone  IV azithromycin   Subjective: No fever or chills. No chest pain or shortness of breath No significant cough or phlegm production.  Objective: Vitals:   04/13/19 2058 04/14/19 0537 04/14/19 0701 04/14/19 0800  BP: (!) 102/53 120/68    Pulse: 74 72    Resp: 16 16    Temp: 98 F (36.7 C) 98.1 F (36.7 C)    TempSrc:      SpO2: 93% 92%  99%  Weight:   51.2 kg   Height:        Intake/Output Summary (Last 24 hours) at 04/14/2019 1059 Last data filed at 04/14/2019 0658 Gross per 24 hour  Intake 480 ml  Output 1550 ml  Net -1070 ml   Filed Weights   04/11/19 0500 04/13/19 0500 04/14/19 0701  Weight: 50.3 kg 50.4 kg 51.2 kg    Examination:  General exam: Appears cachectic and disheveled. Respiratory system: Decreased air entry. Cardiovascular system: S1 & S2 heard Gastrointestinal system: Abdomen is  nondistended, soft and nontender. No organomegaly or masses felt. Normal bowel sounds heard. Central nervous system: Alert and oriented.  Patient moves all extremities.   Extremities: No leg edema.  Data Reviewed: I have personally reviewed following labs and imaging studies  CBC: Recent Labs  Lab 04/10/19 0630 04/10/19 1015 04/10/19 2030 04/11/19 0500 04/12/19 0500 04/13/19 1158  WBC 16.4* 17.1*  --  27.9* 16.3* 14.1*  NEUTROABS  --   --   --  22.5* 11.5* 10.2*  HGB 6.5* 6.6* 8.3* 8.9* 8.1* 8.3*  HCT 20.4* 21.3*  19.5* 26.1* 28.5* 26.4* 27.3*  MCV 87.2 87.3  --  86.9 90.1 91.0  PLT 383 394  --  492* 512* 610*   Basic Metabolic Panel: Recent Labs  Lab 04/10/19 0630 04/11/19 0500 04/11/19 1807 04/12/19 0500 04/13/19 1158  NA 135 138 141 142 140  K 3.8 2.8* 3.5 4.0 4.0  CL 98 96* 102 103 105  CO2 27 30 30 29 27   GLUCOSE 89 133* 104* 99 109*  BUN 19 24* 29* 25* 20  CREATININE 1.08 1.22 1.37* 1.34* 1.21  CALCIUM 7.6* 8.2* 7.7* 8.2* 8.0*  MG  --  1.8  --  2.3  --   PHOS  --   --   --  2.9 3.1   GFR: Estimated Creatinine Clearance: 38.2 mL/min (by C-G formula based on SCr of 1.21  mg/dL). Liver Function Tests: Recent Labs  Lab 04/10/19 0630 04/11/19 0500 04/11/19 1250 04/12/19 0500 04/13/19 1158  AST 13* 11*  --   --   --   ALT 12 13  --   --   --   ALKPHOS 57 66  --   --   --   BILITOT 0.5 1.3*  --   --   --   PROT 5.7* 6.5 6.0*  --   --   ALBUMIN 2.3* 2.6*  --  2.3* 2.3*   No results for input(s): LIPASE, AMYLASE in the last 168 hours. No results for input(s): AMMONIA in the last 168 hours. Coagulation Profile: No results for input(s): INR, PROTIME in the last 168 hours. Cardiac Enzymes: No results for input(s): CKTOTAL, CKMB, CKMBINDEX, TROPONINI in the last 168 hours. BNP (last 3 results) No results for input(s): PROBNP in the last 8760 hours. HbA1C: No results for input(s): HGBA1C in the last 72 hours. CBG: Recent Labs  Lab 04/13/19 0823 04/13/19  1144 04/13/19 1632 04/13/19 2100 04/14/19 0800  GLUCAP 85 106* 137* 150* 90   Lipid Profile: Recent Labs    04/11/19 1250  CHOL 100   Thyroid Function Tests: No results for input(s): TSH, T4TOTAL, FREET4, T3FREE, THYROIDAB in the last 72 hours. Anemia Panel: No results for input(s): VITAMINB12, FOLATE, FERRITIN, TIBC, IRON, RETICCTPCT in the last 72 hours. Urine analysis: No results found for: COLORURINE, APPEARANCEUR, LABSPEC, PHURINE, GLUCOSEU, HGBUR, BILIRUBINUR, KETONESUR, PROTEINUR, UROBILINOGEN, NITRITE, LEUKOCYTESUR Sepsis Labs: @LABRCNTIP (procalcitonin:4,lacticidven:4)  ) Recent Results (from the past 240 hour(s))  MRSA PCR Screening     Status: None   Collection Time: 04/10/19  4:36 AM   Specimen: Nasal Mucosa; Nasopharyngeal  Result Value Ref Range Status   MRSA by PCR NEGATIVE NEGATIVE Final    Comment:        The GeneXpert MRSA Assay (FDA approved for NASAL specimens only), is one component of a comprehensive MRSA colonization surveillance program. It is not intended to diagnose MRSA infection nor to guide or monitor treatment for MRSA infections. Performed at [facility], [address] [place], [place]   Body fluid culture     Status: None (Preliminary result)   Collection Time: 04/11/19 11:31 AM   Specimen: Thoracentesis; Body Fluid  Result Value Ref Range Status   Specimen Description   Final    THORACENTESIS Performed at [facility], [address] [place], [place]    Special Requests   Final    Normal Performed at [facility], [address] [place], [place]    Gram Stain   Final    MODERATE WBC PRESENT, PREDOMINANTLY PMN NO ORGANISMS SEEN    Culture   Final    NO GROWTH 3 DAYS Performed at [facility], [address], [place], [place]    Report Status PENDING  Incomplete         Radiology Studies: No results found.       Scheduled Meds: . sodium chloride   Intravenous Once  . aspirin EC  81 mg Oral Daily  . azithromycin  500 mg Oral Daily  . carvedilol  6.25 mg Oral BID WC  . Chlorhexidine Gluconate Cloth  6 each Topical Daily  . enoxaparin (LOVENOX) injection  40 mg Subcutaneous Q24H  . feeding supplement (ENSURE ENLIVE)  237 mL Oral TID BM  . ferrous sulfate  325 mg Oral BID WC  .  insulin aspart  0-5 Units Subcutaneous QHS  . insulin aspart  0-9 Units Subcutaneous TID WC  . lisinopril  5 mg Oral Daily  . mouth rinse  15 mL Mouth Rinse BID  . mirtazapine  30 mg Oral QHS  . mometasone-formoterol  2 puff Inhalation BID  . multivitamin with minerals  1 tablet Oral Daily  . pantoprazole  40 mg Oral Daily  . sodium chloride flush  10-40 mL Intracatheter Q12H  . sodium chloride flush  3 mL Intravenous Q12H  . ticagrelor  90 mg Oral BID  . vitamin B-12  1,000 mcg Oral Daily   Continuous Infusions: . sodium chloride Stopped (04/10/19 1146)  . cefTRIAXone (ROCEPHIN)  IV 1 g (04/14/19 0908)     LOS: 4 days    Time spent: 25 minutes.   Berton Mount, MD  [facility] Pager #: [phone] 7PM-7AM contact night coverage as above

## 2019-04-14 NOTE — Progress Notes (Signed)
  Echocardiogram 2D Echocardiogram has been performed.  Eduardo Osborne G Eduardo Osborne 04/14/2019, 1:00 PM

## 2019-04-14 NOTE — Evaluation (Signed)
Physical Therapy Evaluation Patient Details Name: Eduardo Osborne MRN: [id number] DOB: [birth year] Today's Date: 04/14/2019   History of Present Illness  Patient is a 73 year old Caucasian male, cachectic, disheveled, possibly failing to thrive, with PMH significant for CAD, AFib, DM2, anemia, COPD on home oxygen, 60 pack year smoking, and history of 8 mm of lung mass 07/22/2018.  He was admitted for management of lung mass.  CT findings suggestive of CAD and aortic atherosclerosis were noted. Pt is  s/p Thoracentesis on 04/11/19. Patient has had intermittent episodes of hypotension that have responded to volume resuscitation.  Clinical Impression   Pt was evaluated by PT after encouragement/education from PT and RN.  He presents with impairments listed below (see PT problem list).  Pt required min A of 2 for mobility, he was unsteady with gait.  Pt was largely limited by respiratory status with O2 sats dropping to 80% with minimal activity despite increasing O2 to 6 LPM for activity (returned to 4 LPM at rest.)  Pt is resistant to going to a SNF at d/c ; however, he is unsafe to return home alone and has no one to assist.  Additionally, pt making statements such as "I might as well just die."  Pt will benefit from acute PT to progress as able.     Follow Up Recommendations SNF    Equipment Recommendations       Recommendations for Other Services       Precautions / Restrictions Precautions Precautions: Fall Precaution Comments: O2-monitor sats      Mobility  Bed Mobility Overal bed mobility: Needs Assistance Bed Mobility: Supine to Sit;Sit to Supine     Supine to sit: Min assist Sit to supine: Min assist   General bed mobility comments: encouragement to participate; cued for technique  Transfers Overall transfer level: Needs assistance Equipment used: Rolling walker (2 wheeled) Transfers: Sit to/from Stand Sit to Stand: Min assist         General transfer comment: cued to  push up  Ambulation/Gait Ambulation/Gait assistance: Min assist;+2 safety/equipment Gait Distance (Feet): 10 Feet Assistive device: Rolling walker (2 wheeled) Gait Pattern/deviations: Decreased stride length;Drifts right/left     General Gait Details: very SHOB; shakey; fatigued easily  Careers information officer    Modified Rankin (Stroke Patients Only)       Balance Overall balance assessment: Needs assistance Sitting-balance support: Bilateral upper extremity supported;Feet supported Sitting balance-Leahy Scale: Fair     Standing balance support: Bilateral upper extremity supported Standing balance-Leahy Scale: Poor                               Pertinent Vitals/Pain Pain Assessment: No/denies pain    Home Living Family/patient expects to be discharged to:: Private residence Living Arrangements: Alone Available Help at Discharge: Other (Comment)(no assist available) Type of Home: House Home Access: Stairs to enter Entrance Stairs-Rails: Right;Left;Can reach both Entrance Stairs-Number of Steps: 3 Home Layout: One level Home Equipment: Emergency planning/management officer - 4 wheels;Cane - single point;Hospital bed Additional Comments: does own cooking (typically microwave); landlord assist with grocery shopping    Prior Function Level of Independence: Independent with assistive device(s)         Comments: used SPC and shower chair on 3L home O2     Hand Dominance        Extremity/Trunk Assessment   Upper Extremity Assessment Upper Extremity  Assessment: Generalized weakness    Lower Extremity Assessment Lower Extremity Assessment: Generalized weakness(Demonstrating at least 3/5 but not further tested due to respiratory status)    Cervical / Trunk Assessment Cervical / Trunk Assessment: Kyphotic  Communication   Communication: No difficulties  Cognition Arousal/Alertness: Awake/alert Behavior During Therapy: Anxious Overall  Cognitive Status: No family/caregiver present to determine baseline cognitive functioning Area of Impairment: Safety/judgement                         Safety/Judgement: Decreased awareness of safety     General Comments: Pt reports no assist at home, realizes that he is not able to manage alone at home but declines to go to a SNF,  pt stated things like "well 73 years is long enough to be on this Earth", "I'll eventually get to where I can't walk", "I might as well just die".  Additionally, pt stated "well maybe if they hadn't let me lay in bed for days, I would be better" - note pt refused therapy in past      General Comments General comments (skin integrity, edema, etc.): Pt on 4 LPM O2 rest with sats 96% and HR 90 bpm.  With sitting EOB sats down to 84%, cued pt to focus on breathing but sats unchanged.  Increased O2 to 6 LPM and sats up to 86% no further improvement after 3 minutes.  Cued pt to stand and take steps to [facility] to return to supine, but he stated "no lets do this, walk to the door", discussed low O2 but pt walked to door and back.  Sats down to 80% and pt very SHOB.  Took 6-7 minutes to recover on 6 LPM and pt returned to supine with HOB elevated.  Returned to 4 LPM sats 94%.  RN present.    Exercises     Assessment/Plan    PT Assessment Patient needs continued PT services  PT Problem List Decreased strength;Decreased mobility;Decreased safety awareness;Decreased coordination;Decreased activity tolerance;Cardiopulmonary status limiting activity;Decreased balance       PT Treatment Interventions DME instruction;Therapeutic exercise;Gait training;Balance training;Functional mobility training;Therapeutic activities;Patient/family education    PT Goals (Current goals can be found in the Care Plan section)  Acute Rehab PT Goals Patient Stated Goal: go home - "I'm not going to rehab, I've done it, they treat you like a dog" PT Goal Formulation: With patient Time For  Goal Achievement: 04/28/19 Potential to Achieve Goals: Poor    Frequency Min 3X/week   Barriers to discharge Decreased caregiver support respiratory status    Co-evaluation               AM-PAC PT "6 Clicks" Mobility  Outcome Measure Help needed turning from your back to your side while in a flat bed without using bedrails?: A Lot Help needed moving from lying on your back to sitting on the side of a flat bed without using bedrails?: A Lot Help needed moving to and from a bed to a chair (including a wheelchair)?: A Lot Help needed standing up from a chair using your arms (e.g., wheelchair or bedside chair)?: A Lot Help needed to walk in hospital room?: A Lot Help needed climbing 3-5 steps with a railing? : Total 6 Click Score: 11    End of Session Equipment Utilized During Treatment: Gait belt Activity Tolerance: Patient limited by fatigue Patient left: in bed;with bed alarm set;with SCD's reapplied Nurse Communication: Mobility status PT Visit Diagnosis: Other abnormalities of  gait and mobility (R26.89);Muscle weakness (generalized) (M62.81)    Time: [id number] PT Time Calculation (min) (ACUTE ONLY): 27 min   Charges:   PT Evaluation $PT Eval Moderate Complexity: 1 Mod PT Treatments $Gait Training: 8-22 mins        Royetta Asalacia Lachell Rochette, PT Acute Rehab Services [phone]   Rayetta HumphreyDacia H Tobi Groesbeck 04/14/2019, 3:52 PM

## 2019-04-14 NOTE — Progress Notes (Signed)
Patient states he has no transportation to get back to [facility] for follow-up post-discharge appointments.  Juliann Pulse, RN CM informed and states patient will have to set up his own travel for his appointments.

## 2019-04-14 NOTE — Progress Notes (Signed)
   NAME:  Eduardo Osborne, MRN:  [id number], DOB:  Nov 04, 1945, LOS: 4 ADMISSION DATE:  04/10/2019, CONSULTATION DATE:  04/11/2019 REFERRING MD:  [facility] Dr Marthenia Rolling, CHIEF COMPLAINT:  Breathing trouble   Brief History    73 year old frail cachectic male.  Patient is almost nonexistent historian other than saying that he short of breath.  History is gathered from the review of the chart essentially.  Pulmonary has been consulted for the right upper lobe lung mass greater than 6 cm.  Past Medical History     has a past medical history of AAA (abdominal aortic aneurysm) ([place]) (04/06/2019), CAD (coronary artery disease), COPD (chronic obstructive pulmonary disease) ([place]), Diabetes ([place]), Hypertension, and Lung mass (04/06/2019).   reports that he has been smoking cigarettes. He has never used smokeless tobacco.  Past Surgical History:  Procedure Laterality Date  . APPENDECTOMY    . CHOLECYSTECTOMY    . [facility] Events   04/10/2019 - admit 04/11/2019 - consult. thora completed. Exudate. Cytology neg from path review.  11/8 pt wants to go home. Agrees to out-pt eval after home issues addressed.   Consults:  11/6 - pulm consult  Procedures:  11/6 - planned thora - LDH 105 , TP < 3.0, Cytology - pending  Significant Diagnostic Tests:  x  Micro Data:  11/5 - MRSA PCR - neg 11/6 - pleural fluid - neg as of 11/8   Antimicrobials:  11/5 ceftriaxone 11/5 azithro >>   Interim history/subjective:  Wants to go home   Objective   Blood pressure 120/68, pulse 72, temperature 98.1 F (36.7 C), resp. rate 16, height 5\' 11"  (1.803 m), weight 51.2 kg, SpO2 99 %.        Intake/Output Summary (Last 24 hours) at 04/14/2019 1151 Last data filed at 04/14/2019 0658 Gross per 24 hour  Intake 480 ml  Output 1550 ml  Net -1070 ml   Filed Weights   04/11/19 0500 04/13/19 0500 04/14/19 0701  Weight: 50.3 kg 50.4 kg 51.2 kg     Examination: General this is a frail cachectic white male. Resting in bed.  HENT NCAT no JVD MMM Card RRR  Pulm scattered rhonchi. No accessory use abd not tender + bowel sounds Neuro intact Ext brisk CR   No results found.   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   I am not convinced he is a good candidate for any possible cancer treatment let alone diagnostics   Plan F/u with Eric Form at 1145 Nov 16th Cont dual antiplatelet therapy Cont BDs Will need out-pt spirometry and DLCO Out-pt PET scan Will need ENB but he is high risk for anesthesia and to date reluctant to agree to procedures.   Erick Colace ACNP-BC [place] Pager # [phone] OR # [phone] if no answer

## 2019-04-14 NOTE — Care Management Important Message (Signed)
Important Message  Patient Details IM Letter given to Dessa Phi RN to present to the Patient Name: Eduardo Osborne MRN: [id number] Date of Birth: 03/19/1946   Medicare Important Message Given:  Yes     Kerin Salen 04/14/2019, 1:10 PM

## 2019-04-15 LAB — GLUCOSE, CAPILLARY: Glucose-Capillary: 105 mg/dL — ABNORMAL HIGH (ref 70–99)

## 2019-04-15 SURGERY — VIDEO BRONCHOSCOPY WITH ENDOBRONCHIAL NAVIGATION
Anesthesia: General

## 2019-04-15 MED ORDER — ENSURE ENLIVE PO LIQD: 237.0000 mL | Freq: Three times a day (TID) | ORAL | 12 refills | Status: AC

## 2019-04-15 MED ORDER — LISINOPRIL 2.5 MG PO TABS: 2.5000 mg | ORAL_TABLET | Freq: Every day | ORAL | 0 refills | Status: AC

## 2019-04-15 MED ORDER — CEFDINIR 300 MG PO CAPS: 300.0000 mg | ORAL_CAPSULE | Freq: Two times a day (BID) | ORAL | 0 refills | Status: AC

## 2019-04-15 MED ORDER — CARVEDILOL 3.125 MG PO TABS: 3.1250 mg | ORAL_TABLET | Freq: Two times a day (BID) | ORAL | 0 refills | Status: AC

## 2019-04-15 MED ORDER — ADULT MULTIVITAMIN W/MINERALS CH: 1.0000 | ORAL_TABLET | Freq: Every day | ORAL | 0 refills | Status: AC

## 2019-04-15 NOTE — TOC Transition Note (Signed)
Transition of Care [facility]) - CM/SW Discharge Note   Patient Details  Name: Eduardo Osborne MRN: [id number] Date of Birth: April 12, 1946  Transition of Care [facility]) CM/SW Contact:  Dessa Phi, RN Phone Number: 04/15/2019, 4:27 PM   Clinical Narrative:After several call to set up transportation for patient the outcome JJ:[employer] for transport home-patient unable to pay for PTAR,his Son Damire is out of town lives in [place] & unable to pay for PTAR-$1450,& unable to pay for $121 for taxi. Paitent is limited w/mobility,uses home 02-for d/c home will utilize [employer] voucher-approved by [employer]. [employer] called & will call [facility] tel#[phone] when @ [place] entrance. nsg aware of voucher in shadow chart. No further CM needs.       Final next level of care: Home w Home Health Services Barriers to Discharge: No Barriers Identified   Patient Goals and CMS Choice   CMS Medicare.gov Compare Post Acute Care list provided to:: Patient Choice offered to / list presented to : Adult Children  Discharge Placement                       Discharge Plan and Services                DME Arranged: Oxygen DME Agency: [employer] Date DME Agency Contacted: 04/15/19 Time DME Agency Contacted: [phone]   [place] Arranged: Nurse's Aide HH Agency: [place] Date HH Agency Contacted: 04/14/19 Time [place]: [phone] Representative spoke with at [place]: [place] Determinants of Health ([place]) Interventions     Readmission Risk Interventions No flowsheet data found.

## 2019-04-15 NOTE — Progress Notes (Addendum)
SATURATION QUALIFICATIONS: (This note is used to comply with regulatory documentation for home oxygen)  Patient Saturations on Room Air at Rest = 90%  Patient Saturations on Room Air while Ambulating = 88%  Patient Saturations on 4 Liters of oxygen while Ambulating = 95%  Please briefly explain why patient needs home oxygen:Pt requires O2 due to desaturating upon movement or ambulation.

## 2019-04-15 NOTE — Progress Notes (Signed)
Pts IVs removed, belongings returned to patient from hospital safe, discharge instructions reviewed with no questions from patient. Patient informed to turn home O2 to 4L when he returns home via taxi ride.   Pt did state that "he could not take more than 15-20 steps without getting worn out" this RN informed the patient that PT recommended SNF patient continued to refuse stating, "I KNOW ABOUT THAT, I'M GOING HOME". RN attempted to educate patient on this options but patient continued to insist that he wanted to go home.

## 2019-04-15 NOTE — TOC Progression Note (Signed)
Transition of Care [facility]) - Progression Note    Patient Details  Name: Ramzy Cappelletti MRN: [id number] Date of Birth: 1945/11/13  Transition of Care [facility]) CM/SW Contact  Derrien Anschutz, Juliann Pulse, RN Phone Number: 04/15/2019, 11:50 AM  Clinical Narrative:await 02 sats to be documented-patient currently is active w/[facility] for home 02 2lnc continuous-will need new 02 sats,& home 02 order, [facility] will delver travel tank to hospital w/extra tubing -MD/Nsg  Aware. D/c plan home w/HHC-amedysis awaiting HHRN/PT/OT/aide face to face order. Patient states his landlord will transport home.Patient states he lives alone but home is safe-he has to work out his finances for his rent w/his landlord. Patient can utilize tel#[phone] for emergency housing if necessary.         Barriers to Discharge: Continued Medical Work up  Expected Discharge Plan and Services                                     HH Arranged: PT, RN, OT   Date [facility] Agency Contacted: 04/14/19 Time [place]: [phone] Representative spoke with at [place]: [place] Determinants of Health ([place]) Interventions    Readmission Risk Interventions No flowsheet data found.

## 2019-04-15 NOTE — TOC Progression Note (Signed)
Transition of Care [facility]) - Progression Note    Patient Details  Name: Eduardo Osborne MRN: [id number] Date of Birth: 22-Feb-1946  Transition of Care [facility]) CM/SW Contact  Jonel Weldon, Juliann Pulse, RN Phone Number: 04/15/2019, 4:31 PM  Clinical Narrative:[facility] home 02-will deliver home 02 4l to patient's home-patient already has concentrator in the home-he will adjust to 4l flow-Nsg informed patient-patient voiced understanding.         Barriers to Discharge: No Barriers Identified  Expected Discharge Plan and Services           Expected Discharge Date: 04/15/19               DME Arranged: Oxygen DME Agency: [facility] Date DME Agency Contacted: 04/15/19 Time DME Agency Contacted: [phone]   [place] Arranged: Nurse's Aide HH Agency: [place] Date HH Agency Contacted: 04/14/19 Time [place]: [phone] Representative spoke with at [place]: [place] Determinants of Health (Home) Interventions    Readmission Risk Interventions No flowsheet data found.

## 2019-04-15 NOTE — Progress Notes (Signed)
Calorie Count Note: Day 2  48-hour calorie count ordered. Day 2 results below.  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

## 2019-04-15 NOTE — TOC Transition Note (Signed)
Transition of Care [facility]) - CM/SW Discharge Note   Patient Details  Name: Eduardo Osborne MRN: [id number] Date of Birth: [birth year]  Transition of Care [facility]) CM/SW Contact:  Dessa Phi, RN Phone Number: 04/15/2019, 12:59 PM   Clinical Narrative:   Patient will d/c home w/HHC per his request-he declined SNF. [facility] for hom 02 has already delvered a travel tank to rm. Noted 02 sats. Once home 02 in place will fax to [facility] for additional deliveries to home. Patient has own transport set up. No further CM needs.    Final next level of care: Home w Home Health Services Barriers to Discharge: No Barriers Identified   Patient Goals and CMS Choice   CMS Medicare.gov Compare Post Acute Care list provided to:: Patient Choice offered to / list presented to : Adult Children  Discharge Placement                       Discharge Plan and Services                DME Arranged: Oxygen DME Agency: [facility] Date DME Agency Contacted: 04/15/19 Time DME Agency Contacted: [phone]   [place] Arranged: Nurse's Aide HH Agency: [place] Date HH Agency Contacted: 04/14/19 Time [place]: [phone] Representative spoke with at [place]: [place] Determinants of Health ([place]) Interventions     Readmission Risk Interventions No flowsheet data found.

## 2019-04-15 NOTE — Progress Notes (Signed)
Pt refusing 1200 CBG. MD notified. No new orders.

## 2019-04-15 NOTE — Discharge Summary (Signed)
Physician Discharge Summary  Patient ID: Eduardo Osborne MRN: [id number] DOB/AGE: Jan 22, 1946 73 y.o.  Admit date: 04/10/2019 Discharge date: 04/15/2019  Admission Diagnoses:  Discharge Diagnoses:  Principal Problem:   Lung mass Active Problems:   COPD (chronic obstructive pulmonary disease) (HCC)   Protein-calorie malnutrition, severe   S/P thoracentesis   Pleural effusion   Discharged Condition: stable  Hospital Course: Patient is a 73 year old Caucasian male, cachectic, disheveled, possibly failing to thrive, with past medical history significant for coronary artery disease, atrial fibrillation, diabetes mellitus type 2, and anemia, COPD on home oxygen, 60 pack years and history of 8 mm of lung mass also 07/22/2018.  Patient was admitted to [facility] with greater than 6.9 cm right upper lobe lung mass, possible pneumonia/sepsis.  Patient was transferred to [facility] for further management of lung mass.  Pulmonary team has been consulted to assist with patient's management.  Significant leukocytosis is noted.  CT chest with contrast done in this hospital on 04/10/2019 revealed indeterminate 2.9 x 6.3 cm anterior medial right upper lobe opacity with mildly enlarged right mediastinal lymph node, said to be suspicious for malignancy but infection could not be ruled out.  1.1 cm right middle lobe groundglass nodule and 1.1 cm right lower lobe irregular opacity was also recommended.  Moderate left-sided effusion was reported with moderate left lower lobe atelectasis.  Findings suggestive of coronary artery disease and aortic atherosclerosis were noted.  Pulmonary team is assisting with patient's care.  Thoracentesis is planned today and pleural fluid will be sent for analysis.  Patient has had intermittent episodes of hypotension that have responded to volume resuscitation.  Further management will depend on hospital course.  Pleural fluid analysis noted, likely  exudative based on light criteria.  Follow final pleural fluid results.  Discussed with pulmonary team, for possible EBUS.   PT and OT input appreciated, both have recommended skilled nursing facility placement.  Patient is adamant he would not want to go to skilled nursing facility.  Patient insists on being discharged back on tomorrow.  We have no other option but to discharge patient home with home health PT OT tomorrow.  Also discussed with the pulmonary team, patient will follow up with the pulmonary team next week Monday.  Right upper lobe lung mass:  -Repeat CT chest is as documented above.   -Pulmonary input is highly appreciated, for possible EBUS.   -Patient is status post thoracentesis, pleural fluid analysis reviewed.   -Patient has more than 60 pack years. -Patient has been pancultured as well.   -Continue IV antibiotics for now. -Follow final culture results.     04/13/2019: Discussed with the pulmonary team.  For possible EBUS. 04/14/2019: Patient will follow with the pulmonary team next week Monday.  Further management will be as per the pulmonary team.  Possible pneumonia/SIRS/sepsis: -Follow cultures.   -Patient is currently on IV ceftriaxone and azithromycin.   -Procalcitonin checked earlier was 0.91.   -Intermittent episodes of hypotension has been documented.   -Further management will depend on hospital course.   -Repeat procalcitonin done earlier today is improving, 0.41. 04/14/2019: Complete course of antibiotics.  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  Compression fracture T12, new mild naterior compression fracture T11 Check vitamin D 25-oh -TSH is 0.625.,  CAD s/p DES Cont Aspirin. Cont Brillinta. Cont Carvedilol (If blood pressure allows). Cont Lisinopril (if blood pressure allows). 04/13/2019: Blood pressure control is improving.  Genella Rife  Cont PPI  Anemia: -Iron level is 8 -Ferritin is 73 -TIBC  is 243 -MCV is normal. -Cannot rule out anemia of chronic inflammation versus anemia secondary to combined iron deficiency and chronic inflammation. -RBC folate level is 1328 -Vitamin B12 is 276. -Fecal occult blood is pending. -Patient has been transfused with 1 unit of packed red blood cells. -Hemoglobin today is 8.9 g/dL (hemoglobin was 6.6 g/dL on 04/10/2019)  Severe malnutrition/weight loss/failure to thrive: Prealbumin is 8.9. Albumin is 2.6. Dietary consult. Patient has good appetite. BMI is 15.47 kg/m. 04/13/2019: Color calm.  Consults: pulmonary/intensive care  Significant Diagnostic Studies:   Discharge Exam: Blood pressure (!) 114/57, pulse 61, temperature 97.7 F (36.5 C), temperature source Oral, resp. rate 20, height 5\' 11"  (1.803 m), weight 51.2 kg, SpO2 90 %.   Disposition: Discharge disposition: 06-Home-Health Care Svc  Discharge Instructions    Diet - low sodium heart healthy   Complete by: As directed    Increase activity slowly   Complete by: As directed      Allergies as of 04/15/2019      Reactions   Aspirin Other (See Comments)   Questionable.. unk.. on paperwork   Penicillins Other (See Comments)   Unk.. on paperwork      Medication List    STOP taking these medications   furosemide 20 MG tablet Commonly known as: LASIX     TAKE these medications   albuterol 108 (90 Base) MCG/ACT inhaler Commonly known as: VENTOLIN HFA Inhale 1-2 puffs into the lungs every 6 (six) hours as needed for wheezing or shortness of breath.   aspirin EC 81 MG tablet Take 81 mg by mouth daily.   budesonide-formoterol 160-4.5 MCG/ACT inhaler Commonly known as: SYMBICORT Inhale 2 puffs into the lungs 2 (two) times daily.   carvedilol 3.125 MG tablet Commonly known as: COREG Take 1 tablet (3.125 mg total) by mouth 2 (two) times daily with a meal. What changed:   medication strength  how much to take   cefdinir 300 MG capsule Commonly known as:  OMNICEF Take 1 capsule (300 mg total) by mouth 2 (two) times daily for 7 days.   feeding supplement (ENSURE ENLIVE) Liqd Take 237 mLs by mouth 3 (three) times daily between meals.   ferrous sulfate 325 (65 FE) MG tablet Take 325 mg by mouth 2 (two) times daily with a meal.   lisinopril 2.5 MG tablet Commonly known as: ZESTRIL Take 1 tablet (2.5 mg total) by mouth daily. What changed:   medication strength  how much to take   mirtazapine 30 MG tablet Commonly known as: REMERON Take 30 mg by mouth at bedtime.   multivitamin with minerals Tabs tablet Take 1 tablet by mouth daily. Start taking on: April 16, 2019   nitroGLYCERIN 0.4 MG SL tablet Commonly known as: NITROSTAT Place 0.4 mg under the tongue every 5 (five) minutes as needed for chest pain.   pantoprazole 40 MG tablet Commonly known as: PROTONIX Take 40 mg by mouth daily.   ticagrelor 90 MG Tabs tablet Commonly known as: BRILINTA Take 90 mg by mouth 2 (two) times daily.   Vitamin B-12 1000 MCG Subl Place 1 each under the tongue daily.            Durable Medical Equipment  (From admission, onward)         Start     Ordered   04/15/19 1318  DME Oxygen  Once    Question Answer Comment  Length  of Need 12 Months   Mode or (Route) Nasal cannula   Liters per Minute 3   Oxygen delivery system Gas      04/15/19 1323         Follow-up Information    Bevelyn NgoGroce, Sarah F, NP Follow up on 04/21/2019.   Specialty: Pulmonary Disease Why: 1145 am  Contact information: [address] [place] [address] [phone]        [facility] Follow up.   Why: HHRN/PT/OT/aide Contact information: [address] [place] [address] [phone]        Inc[facility] Follow up.   Why: home oxygen-[place] office tel#[phone] Contact information: [address] [place] [address] [phone]           Signed: Barnetta ChapelSylvester I Christo Hain 04/15/2019,  1:25 PM

## 2019-04-16 LAB — ADENOSIDE DEAMINASE, PLEURAL FL: ADENOSIDE DEAMINASE, PLEURAL FL: <1.6 U/L (ref 0.0–9.4)

## 2019-04-16 LAB — RHEUMATOID FACTORS, FLUID: Rheumatoid Arthritis, Qn/Fluid: NEGATIVE

## 2019-04-20 LAB — CHOLESTEROL, BODY FLUID: Cholesterol, Fluid: 30 mg/dL

## 2019-04-21 ENCOUNTER — Inpatient Hospital Stay: Payer: Medicare Other | Admitting: Acute Care

## 2019-08-04 DEATH — deceased

## 2020-03-31 IMAGING — DX DG CHEST 1V PORT
2 series · 2 of 2 positions shown · non-contrast
Comparison: None.

CLINICAL DATA: 73-year-old male with questionable right upper lobe
mass.

EXAM:
PORTABLE CHEST 1 VIEW

[chest ap (1 of 2)]
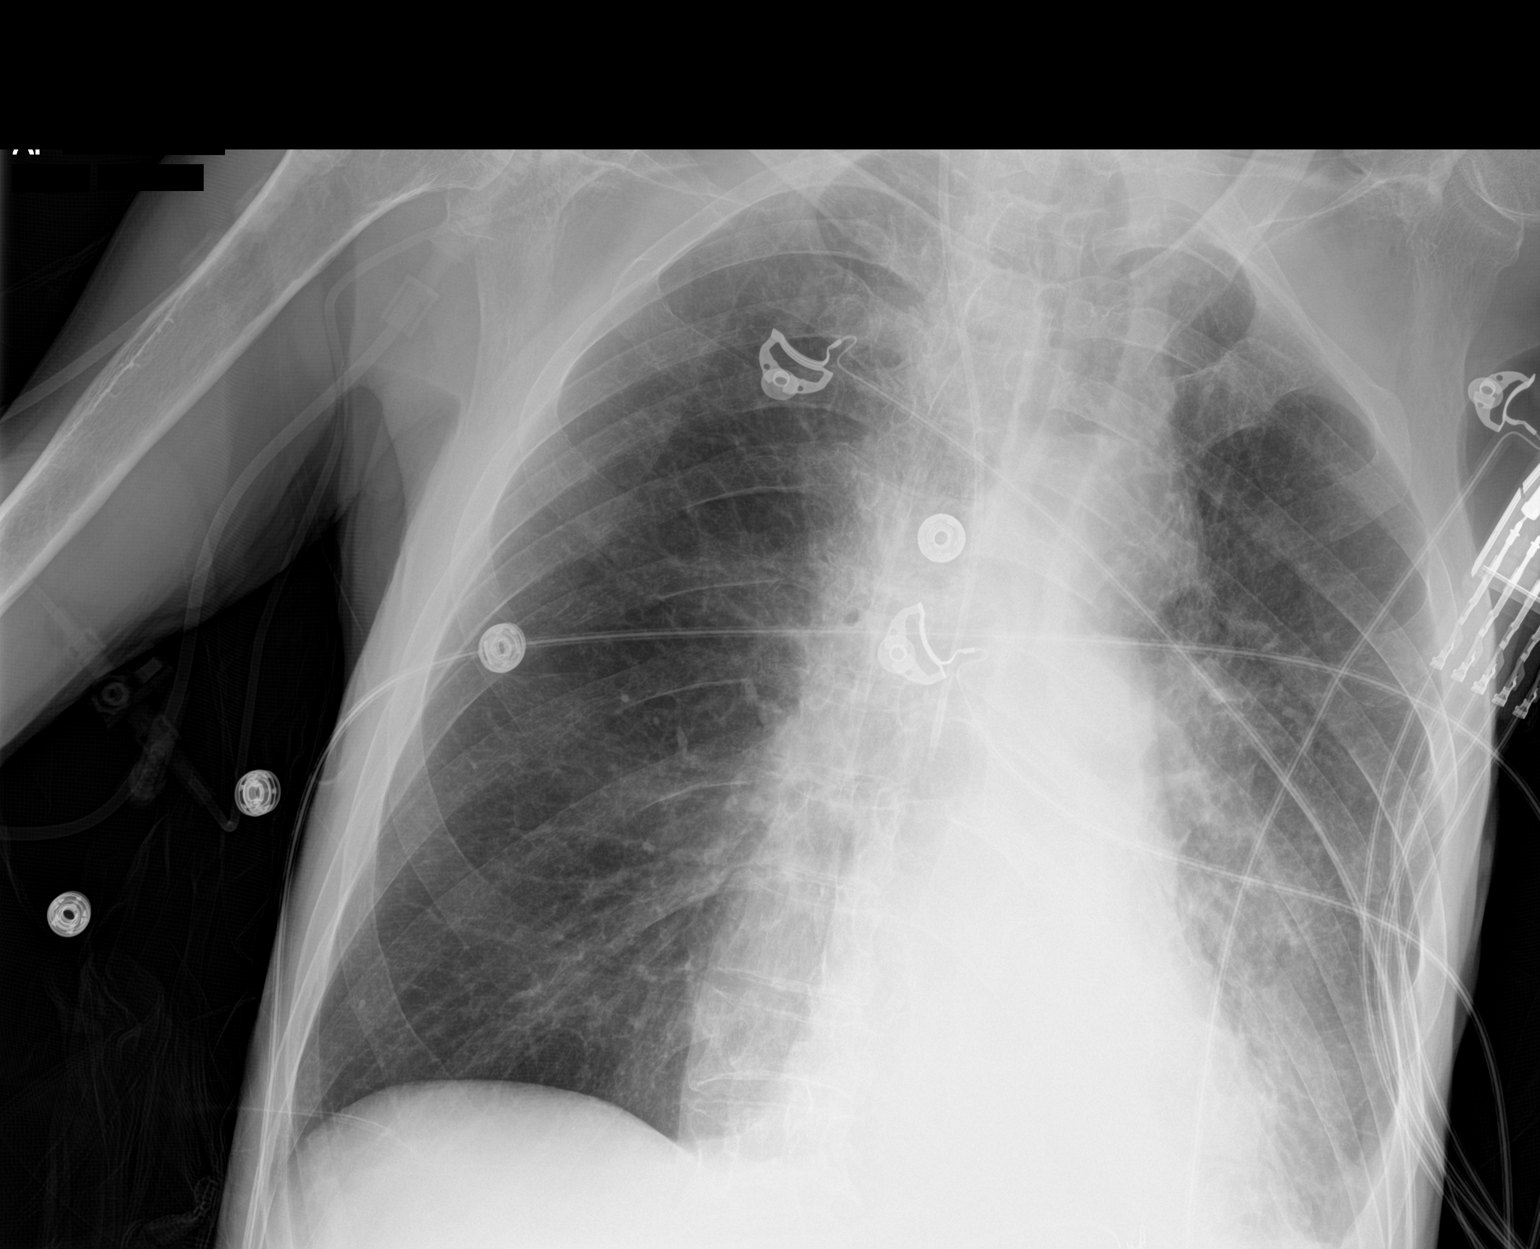

[chest ap (2 of 2)]
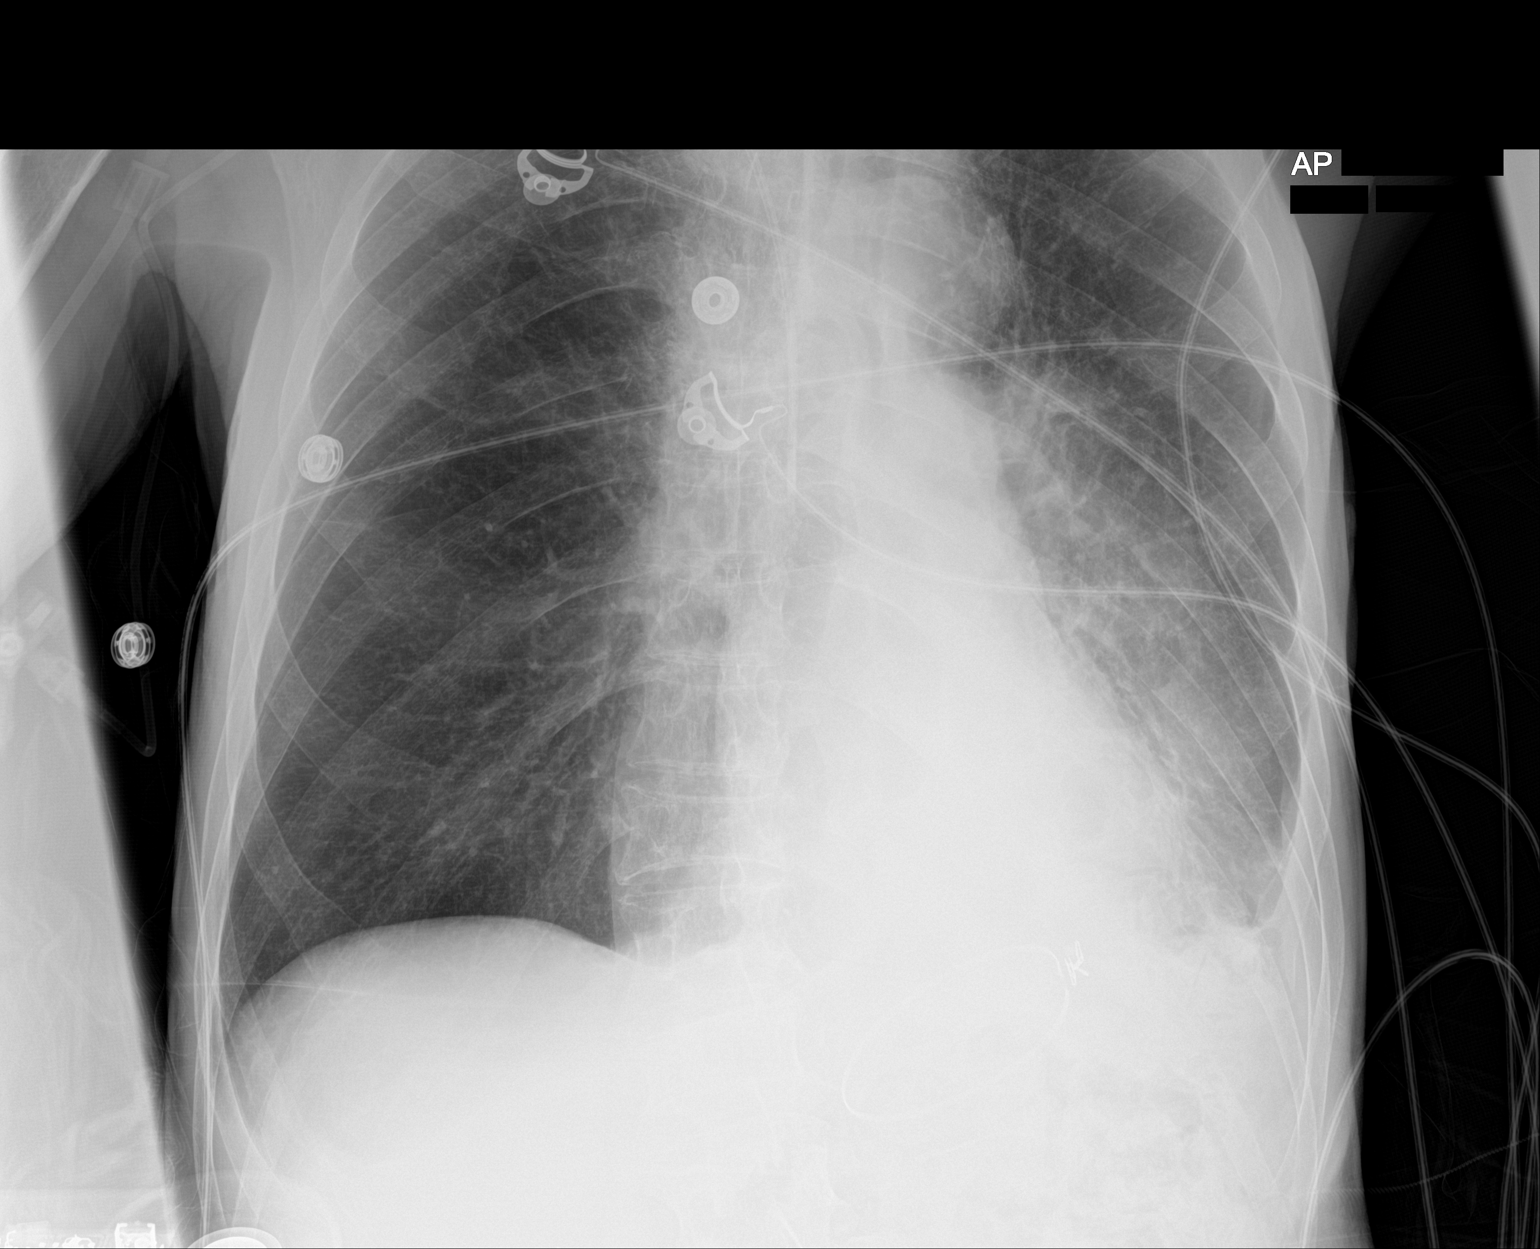

[2 of 2 positions shown; findings below may reference images not displayed]

FINDINGS: Right IJ central venous line with tip over the mediastinum likely
over central SVC. Left lung base densities may represent atelectasis
although pneumonia is not excluded. Clinical correlation is
recommended. A trace left pleural effusion may be present. Bilateral
perihilar densities may represent mild vascular congestion. The
right lung base is clear. There is no pneumothorax. Atherosclerotic
calcification of the aortic arch. Probable cerclage wire over the
left hemidiaphragm. No acute osseous pathology.
IMPRESSION: 1. No mass identified in the right upper lobe as clinically
questioned.
2. Left lung base atelectasis versus infiltrate.

## 2020-03-31 IMAGING — CT CT CHEST W/ CM
2 of 3 series · 15 of 36 positions shown, 18 images · IV contrast (omnipaque)
Comparison: Outside films could not be obtained for comparison.

CLINICAL DATA: 73-year-old male with leukocytosis and possible mass
on outside CT.

EXAM:
CT CHEST WITH CONTRAST
TECHNIQUE: Multidetector CT imaging of the chest was performed during
intravenous contrast administration.
CONTRAST:  75mL OMNIPAQUE IOHEXOL 300 MG/ML  SOLN

[Series 2: axial st · axial · 0.68mm/px · z∈[-475,-193]mm · 12 of 167 slices shown, 15 images]
[im 13/167  mediastinal]
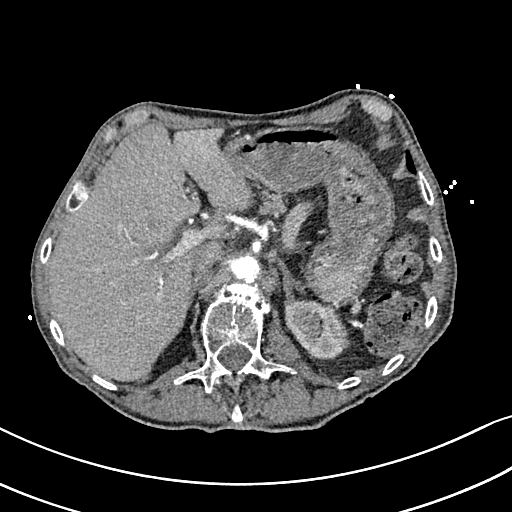
[im 13/167  lung]
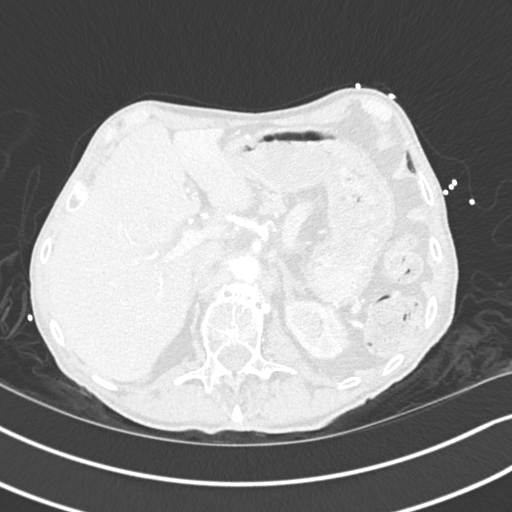
[im 25/167  lung]
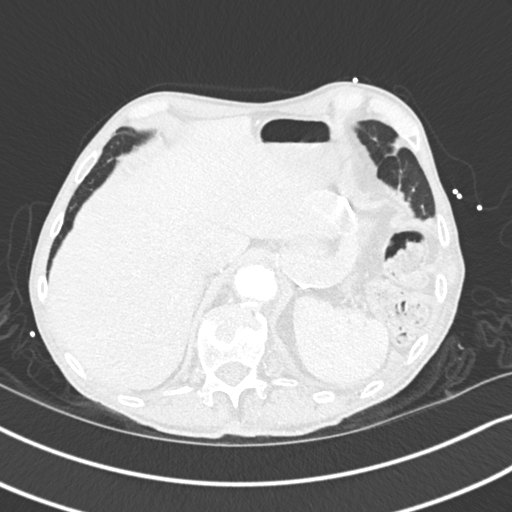
[im 37/167  lung]
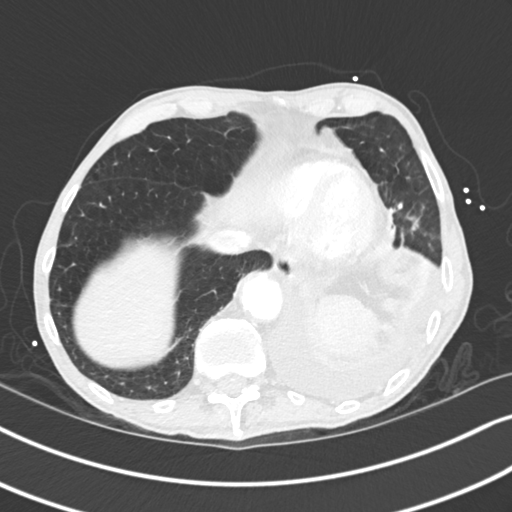
[im 50/167  lung]
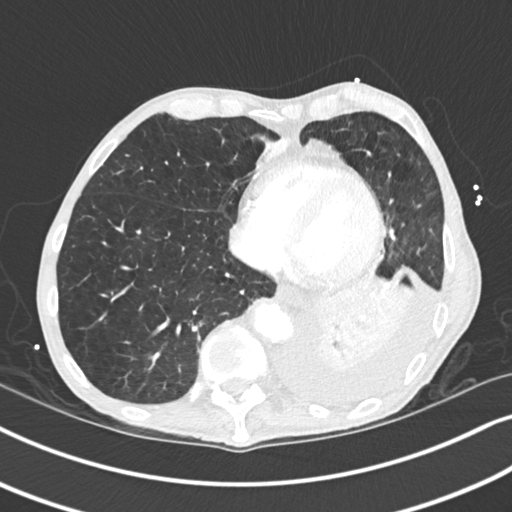
[im 62/167  mediastinal]
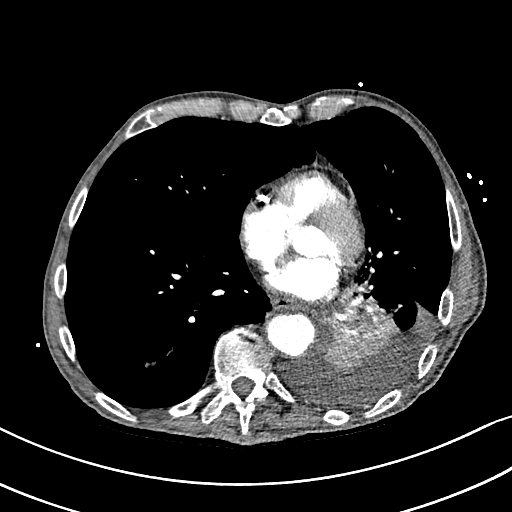
[im 62/167  lung]
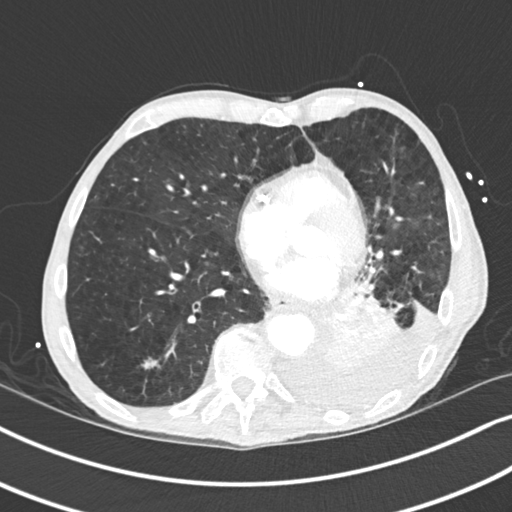
[im 74/167  lung]
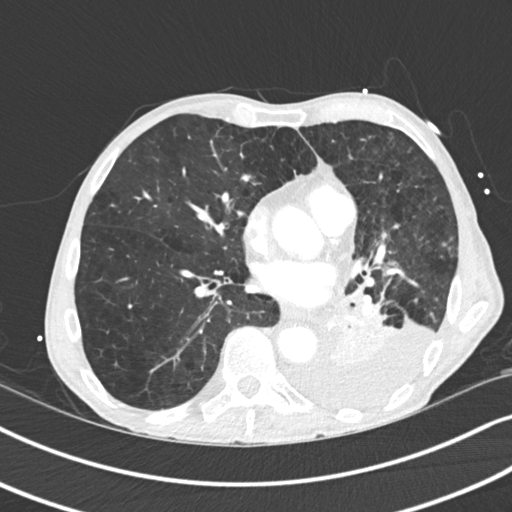
[im 93/167  lung]
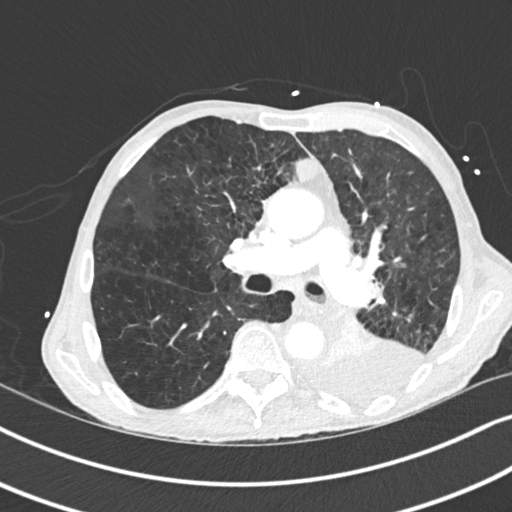
[im 105/167  lung]
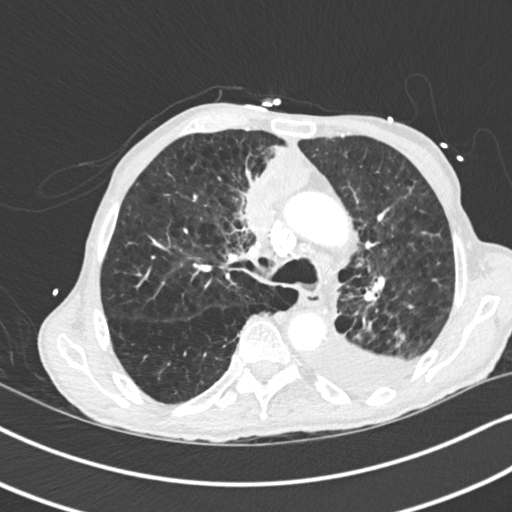
[im 117/167  mediastinal]
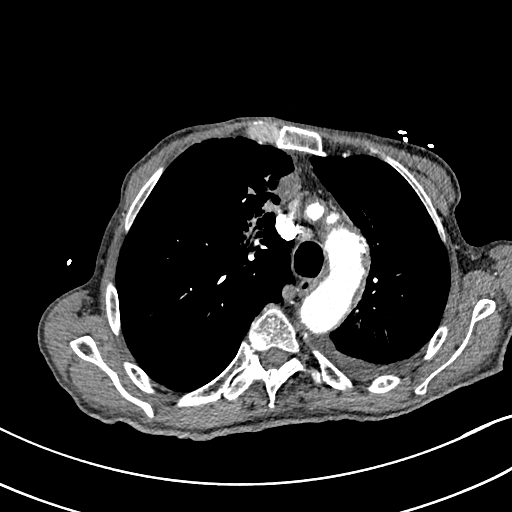
[im 117/167  lung]
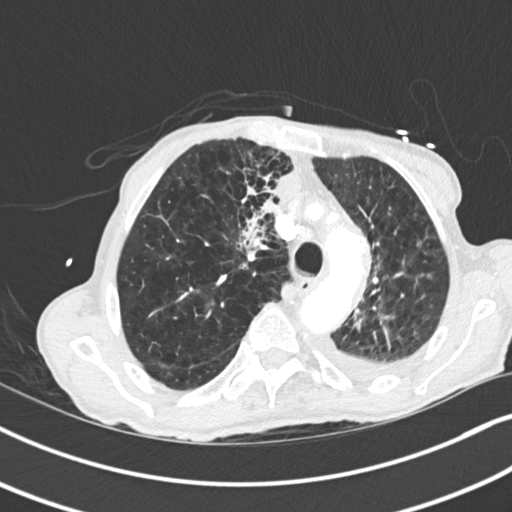
[im 130/167  lung]
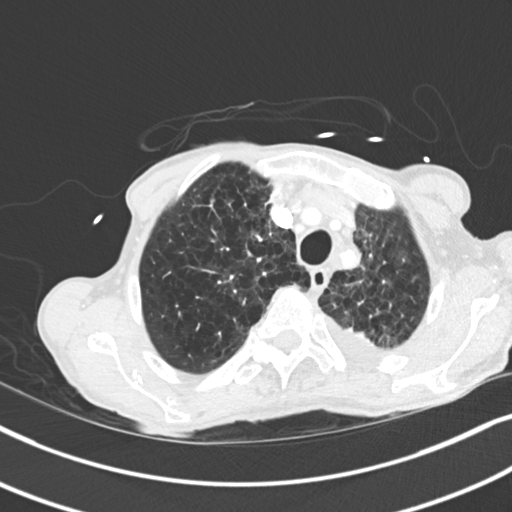
[im 142/167  lung]
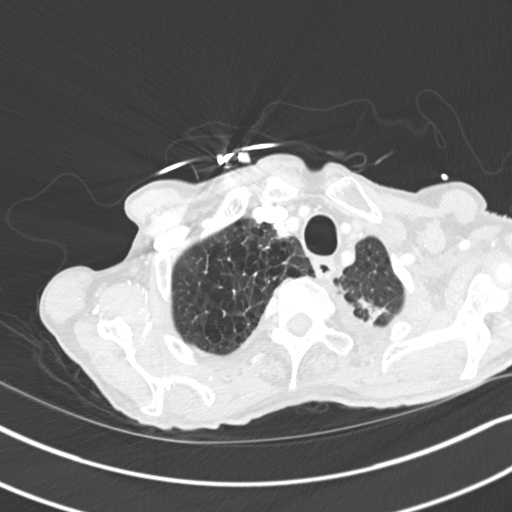
[im 154/167  lung]
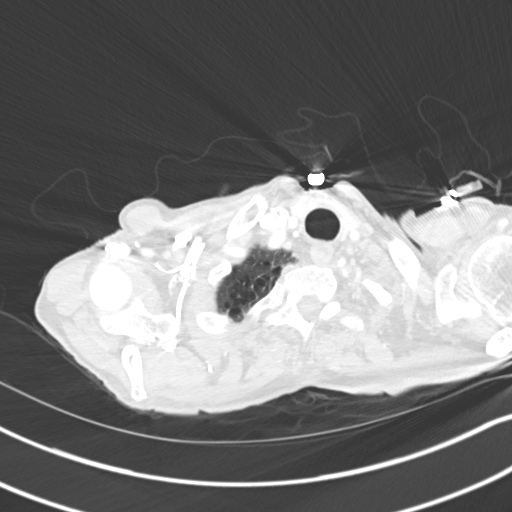

[Series 6: coronal · coronal · 0.64mm/px · 3 of 121 slices shown]
[im 25/121  lung]
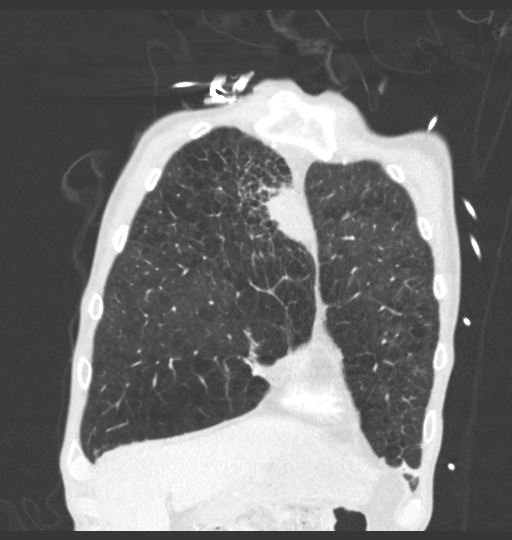
[im 49/121  lung]
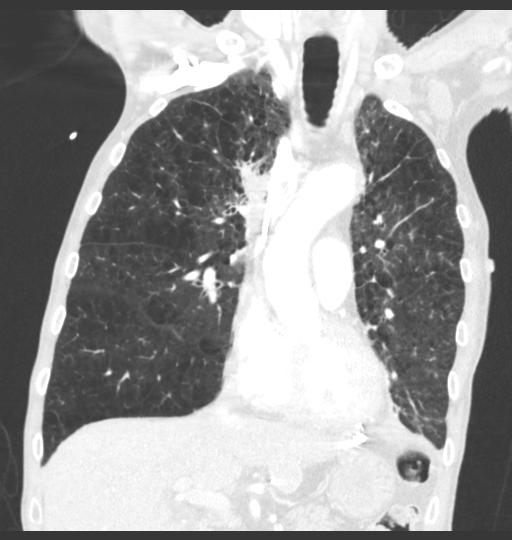
[im 73/121  lung]
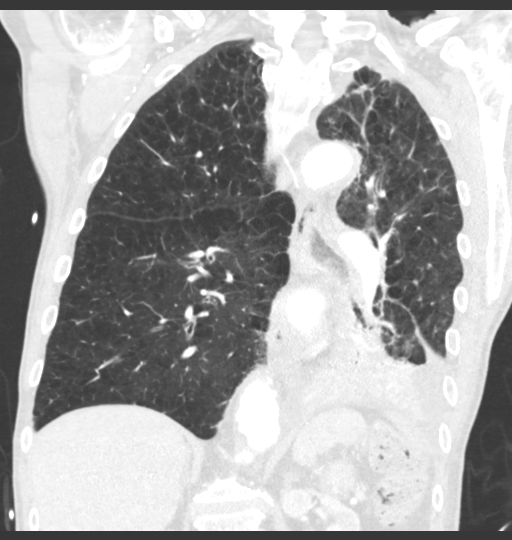

[15 of 36 positions shown; findings below may reference images not displayed]

FINDINGS: Cardiovascular: Heart size is normal. Moderate atherosclerotic
plaque in the transverse descending aorta noted with slight
irregularity. A moderate amount of atherosclerotic plaque within the
proximal LEFT subclavian artery noted with mild irregularity. There
is no evidence of thoracic aortic aneurysm or dissection. Coronary
artery atherosclerotic calcifications are present. No pericardial
effusion noted.

Mediastinum/Nodes: Mildly enlarged RIGHT mediastinal lymph nodes are
present, with the largest nodes measuring 1.2 cm in short axis
(series 2: Image [REDACTED]). No other enlarged lymph nodes are
identified.

Lungs/Pleura: Moderate centrilobular emphysema is noted.

A 2.9 x 6.3 cm opacity involving the anteromedial RIGHT UPPER lobe
is noted with areas of internal low-density. This has a large
contact surface area within RIGHT mediastinum.

A 0.7 cm RIGHT middle lobe ground-glass nodule ([DATE]) and a 1.1 cm
RIGHT LOWER lobe irregular opacity/nodule ([DATE]) are noted.

A moderate LEFT pleural effusion is noted with moderate LEFT LOWER
lung atelectasis. No definite pleural enhancement or nodularity.

Upper Abdomen: No acute abnormality. Gastric band is noted.

Musculoskeletal: No acute or suspicious bony abnormalities are
identified. Compression fractures of T12 (50%) and L1 (30%) are age
indeterminate but do not appear acute. Correlate clinically.
IMPRESSION: 1. Indeterminate 2.9 x 6.3 cm anteromedial RIGHT UPPER lobe opacity
with mildly enlarged RIGHT mediastinal lymph nodes. This is
suspicious for malignancy but could also represent infection.
2. 1.1 cm RIGHT middle lobe ground-glass nodule and 1.1 cm RIGHT
LOWER lobe irregular opacity/nodule. Consider one of the following
in 3 months for both low-risk and high-risk individuals: (a) repeat
chest CT, (b) follow-up PET-CT, or (c) tissue sampling. This
recommendation follows the consensus statement: Guidelines for
Management of Incidental Pulmonary Nodules Detected on CT Images:
3. Moderate LEFT pleural effusion with moderate LEFT LOWER lung
atelectasis.
4. Coronary artery disease.
5. Aortic Atherosclerosis (2ENQO-DR3.3) and Emphysema (2ENQO-KM5.G).
